# Patient Record
Sex: Male | Born: 1980 | Smoking: Never smoker
Health system: Southern US, Community
[De-identification: ages and names within clinical notes are randomized; demographics above are authoritative.]

## PROBLEM LIST (undated history)

## (undated) DIAGNOSIS — G950 Syringomyelia and syringobulbia: Secondary | ICD-10-CM

## (undated) DIAGNOSIS — M419 Scoliosis, unspecified: Secondary | ICD-10-CM

## (undated) DIAGNOSIS — M1990 Unspecified osteoarthritis, unspecified site: Secondary | ICD-10-CM

## (undated) HISTORY — PX: THORACIC FUSION: SHX1062

## (undated) HISTORY — DX: Scoliosis, unspecified: M41.9

## (undated) HISTORY — DX: Syringomyelia and syringobulbia: G95.0

## (undated) HISTORY — PX: IVC FILTER INSERTION: CATH118245

## (undated) HISTORY — DX: Unspecified osteoarthritis, unspecified site: M19.90

---

## 2002-10-07 DIAGNOSIS — T1490XS Injury, unspecified, sequela: Secondary | ICD-10-CM

## 2002-10-07 HISTORY — DX: Injury, unspecified, sequela: T14.90XS

## 2005-08-07 HISTORY — PX: THORACIC LAMINECTOMY: SHX96

## 2017-10-03 DIAGNOSIS — L89154 Pressure ulcer of sacral region, stage 4: Secondary | ICD-10-CM

## 2017-10-03 HISTORY — DX: Pressure ulcer of sacral region, stage 4: L89.154

## 2018-10-05 HISTORY — PX: COSMETIC SURGERY: SHX468

## 2018-11-09 HISTORY — PX: SPINE SURGERY: SHX786

## 2018-12-03 HISTORY — PX: OTHER SURGICAL HISTORY: SHX169

## 2020-04-19 ENCOUNTER — Encounter: Payer: Self-pay | Admitting: Physical Medicine and Rehabilitation

## 2020-05-22 ENCOUNTER — Encounter: Payer: Self-pay | Admitting: Physical Medicine and Rehabilitation

## 2020-05-22 ENCOUNTER — Other Ambulatory Visit: Payer: Self-pay

## 2020-05-22 ENCOUNTER — Encounter
Payer: Medicare Other | Attending: Physical Medicine and Rehabilitation | Admitting: Physical Medicine and Rehabilitation

## 2020-05-22 VITALS — BP 113/65 | HR 98 | Temp 98.1°F | Ht 74.0 in | Wt 155.0 lb

## 2020-05-22 DIAGNOSIS — G8220 Paraplegia, unspecified: Secondary | ICD-10-CM

## 2020-05-22 DIAGNOSIS — G839 Paralytic syndrome, unspecified: Secondary | ICD-10-CM | POA: Insufficient documentation

## 2020-05-22 DIAGNOSIS — R252 Cramp and spasm: Secondary | ICD-10-CM

## 2020-05-22 MED ORDER — DIAZEPAM 5 MG PO TABS
5.0000 mg | ORAL_TABLET | Freq: Two times a day (BID) | ORAL | 5 refills | Status: DC
Start: 2020-05-22 — End: 2020-07-21

## 2020-05-22 NOTE — Patient Instructions (Signed)
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 Suggest Botox of ileopsoas, if possible. Will be difficult- will look into. However, might have difficulties getting to ileopsoas.     2. [phone]- have left message for Dr Roderic Ovens   3. Might try Valium - the gold standard- have attempted to call pt's Pain doctor- x2- will discuss if could do Valium.   4.  Wait to change spasticity meds until d/w Dr Roderic Ovens- really think Valium is going to be the best bet.   5. Get neck surgery ASAP and then suggest if needs inpt rehab afterwards, suggest calling me to come to [facility].  6. Call him to discuss nerve pain meds. - Duloxetine vs Lyrica.   7. F/U in 3-4 weeks for double appointment

## 2020-05-22 NOTE — Progress Notes (Signed)
Subjective:    Patient ID: Devin Collins, adult    DOB: 1981/03/28, 39 y.o.   MRN: [id number]  HPI  Here with partner- [name].   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    Now, can't even lift w/c- cannot drive, cannot do anything.   The littlest bounce- falls out of w/c.Has fallen 5x in last few months.   Is from MRI That we discussed of his cervical spine.   Laminoplasty with possible fusion- is planned.  Wants to put a cage in, to stabilize.   Baclofen 20 mg TID Zanaflex 8 mg TID- takes usually 2x/day.    Pain management- Dr Barrie Dunker.  Perocet- 10/325 mg QID from pain mgmt.    Gets electrica shock through his entire body- so uses gabapentin but not enough.        Pain Inventory Average Pain 7 Pain Right Now 7 My pain is constant, sharp, burning, dull, stabbing, tingling and aching  LOCATION OF PAIN hips middle of back and neck  BOWEL Number of stools per week: 4-5 Oral laxative use Yes  Type of laxative miralax Enema or suppository use No  History of colostomy No  Incontinent digital stim  BLADDER Foley  Self cath not foley   Gets botox with Dr Elana Alm-- [facility] In and out cath, frequency 4-6 x per day Able to self cath Yes  Bladder incontinence Yes  Frequent urination na Leakage with coughing na Difficulty starting stream na Incomplete bladder emptying na   Mobility how many minutes can you walk? na do you drive?  yes use a wheelchair transfers alone  Function employed # of hrs/week na disabled: date disabled 04  Neuro/Psych bladder control problems weakness numbness tingling trouble  walking spasms dizziness  Prior Studies Any changes since last visit?  no  Physicians involved in your care Primary care Rhyne McPhail urologist/ pain management Dr Roderic Ovens [facility]   Family History  Problem Relation Age of Onset  . Arthritis Mother   . Hypertension Mother   . Arthritis Father   . Hyperlipidemia Father    Social History   Socioeconomic History  . Marital status: Unknown    Spouse name: Not on file  . Number of children: Not on file  . Years of education: Not on file  . Highest education level: Not on file  Occupational History  . Not on file  Tobacco Use  . Smoking status: Never Smoker  . Smokeless tobacco: Current User    Types: Snuff  . Tobacco comment: been using since 39 yo  Substance and Sexual Activity  . Alcohol use: Not on file  . Drug use: Yes    Frequency: 7.0 times per week    Types: Marijuana  . Sexual activity: Not on file  Other Topics Concern  . Not on file  Social History Narrative  . Not on file   Social Determinants of Health   Financial Resource Strain:   . Difficulty of Paying Living Expenses:   Food Insecurity:   . Worried About Programme researcher, broadcasting/film/video in the Last Year:   .  Ran Out of Food in the Last Year:   Transportation Needs:   . Freight forwarder (Medical):   Marland Kitchen Lack of Transportation (Non-Medical):   Physical Activity:   . Days of Exercise per Week:   . Minutes of Exercise per Session:   Stress:   . Feeling of Stress :   Social Connections:   . Frequency of Communication with Friends and Family:   . Frequency of Social Gatherings with Friends and Family:   . Attends Religious Services:   . Active Member of Clubs or Organizations:   . Attends Banker Meetings:   Marland Kitchen Marital Status:    Past Surgical History:  Procedure Laterality Date  . COSMETIC SURGERY  10/05/2018   flap closure sacral wound  . IVC FILTER INSERTION    . posterior lumbar laminectomy and fusion  12/03/2018  . SPINE  SURGERY  11/09/2018   lumbar fusion   . THORACIC FUSION     T10-11  . THORACIC LAMINECTOMY  08/2005   Past Medical History:  Diagnosis Date  . Arthritis   . Decubitus ulcer of sacral region, stage 4 (HCC) 10/03/2017  . Post-traumatic paraplegia 2004  . Scoliosis   . 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(HCC)    Pulse 98   Temp 98.1 F (36.7 C)   SpO2 95%   Opioid Risk Score:   Fall Risk Score:  `1  Depression screen PHQ 2/9  Depression screen PHQ 2/9 05/22/2020  Decreased Interest 0  Down, Depressed, Hopeless 1  PHQ - 2 Score 1  Altered sleeping 2  Tired, decreased energy 1  Change in appetite 1  Feeling bad or failure about yourself  2  Trouble concentrating 1  Moving slowly or fidgety/restless 0  Suicidal thoughts 0  PHQ-9 Score 8  Difficult doing work/chores Very difficult    Review of Systems  Constitutional: Negative.   HENT: Negative.   Eyes: Negative.   Respiratory: Negative.   Cardiovascular: Negative.   Gastrointestinal:       Digital stim/laxative for bm  Endocrine: Negative.   Genitourinary:       I&O caths  Musculoskeletal: Positive for back pain, myalgias and neck pain.       Spasms  Skin: Negative.        Prone to breakdowns but healed currently  Allergic/Immunologic: Negative.   Neurological: Positive for weakness.  Hematological: Negative.   Psychiatric/Behavioral: Negative.   All other systems reviewed and are negative.      Objective:   Physical Exam Awake, alert, accompanied by [name],partner, NAD  MS: RUE- deltoid 4/5, biceps 4/5, WE- 5-/5, triceps 4+/5, grip 4+/5, finger abd 4+/5 LUE- deltoid 2/5 (can't get to 90 degrees), biceps 4-/5, WE- 4-/5, triceps 4-/5;  Grip 4-/5, finger abd 4-/5  Neuro: No Hoffman's in UEs- B/L No increased tone in UEs-  LEs- MAS of 2 in LEs- tight- goes into extensor tone with hip extension.       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 Suggest Botox of ileopsoas, if possible. Will be difficult- will look into. However, might have difficulties getting to ileopsoas.     2. [phone]- have left message for Dr Roderic Ovens   3. Might try Valium - the gold standard- have attempted to call pt's Pain doctor- x2- will discuss if could do Valium.   4.  Wait  to change spasticity meds until d/w Dr Roderic Ovens- really think Valium is going to be the best bet.  Wrote for Valium 5 mg 2x/day- can wean Zanaflex somewhat 4 mg 2x/day???  5. Get neck surgery ASAP and then suggest if needs inpt rehab afterwards, suggest calling me to come to [facility].  6. Call him to discuss nerve pain meds. - Duloxetine vs Lyrica.   7. F/U in 3-4 weeks for double appointment  I spent a total of  50 minutes on visit- as detailed above.

## 2020-06-09 ENCOUNTER — Encounter: Payer: Medicare Other | Admitting: Physical Medicine and Rehabilitation

## 2020-07-21 ENCOUNTER — Encounter
Payer: Medicare Other | Attending: Physical Medicine and Rehabilitation | Admitting: Physical Medicine and Rehabilitation

## 2020-07-21 ENCOUNTER — Other Ambulatory Visit: Payer: Self-pay

## 2020-07-21 ENCOUNTER — Encounter: Payer: Self-pay | Admitting: Physical Medicine and Rehabilitation

## 2020-07-21 VITALS — BP 119/65 | HR 75 | Temp 98.3°F

## 2020-07-21 DIAGNOSIS — G8220 Paraplegia, unspecified: Secondary | ICD-10-CM | POA: Diagnosis present

## 2020-07-21 DIAGNOSIS — Z993 Dependence on wheelchair: Secondary | ICD-10-CM | POA: Diagnosis present

## 2020-07-21 DIAGNOSIS — R252 Cramp and spasm: Secondary | ICD-10-CM | POA: Diagnosis present

## 2020-07-21 MED ORDER — DIAZEPAM 5 MG PO TABS
5.0000 mg | ORAL_TABLET | Freq: Three times a day (TID) | ORAL | 5 refills | Status: DC
Start: 2020-07-21 — End: 2021-01-05

## 2020-07-21 MED ORDER — DULOXETINE HCL 30 MG PO CPEP: 30.0000 mg | ORAL_CAPSULE | Freq: Every day | ORAL | 5 refills | Status: DC

## 2020-07-21 NOTE — Patient Instructions (Signed)
1. Will check to see if can get Botox of L hip flexors If possible.   2. Increase Valium 5 mg 3x/day- 1st dose, AM, 2nd dose before dig stim, and then 3rd dose bedtime   3.  Duloxetine /Cymbalta 30 mg nightly x 1 week  Then 60 mg nightly- for nerve pain  1% of patients can have nausea with Duloxetine- call me if needs an anti-nausea medicine. Can also cause mild dry mouth/dry eyes and mild constipation.   4. Continue Gabapentin at current dose- won't increase right now.   5. Con't Baclofen at THREE times per day.  6. Call me and f/u in 10 days- call to put in PT referral AND discuss how Duloxetine working. And spasticity  7. PICC line to be removed- 10/26 the earliest- 11/9 at the latest.  8. F/U in 8 weeks- double appointment.

## 2020-07-21 NOTE — Progress Notes (Signed)
Subjective:    Patient ID: Devin Collins, adult    DOB: Sep 26, 1981, 39 y.o.   MRN: [id number]  HPI   Legs and abdomen spasming- LLE- RLE never jumps.   L hip is higher when sitting and laying down. Than R hip.    Frustrated with everything- hard to do dig stim now and driving him nuts.  Valium 5 mg BID- still taking- helped initially, but not as much now-   decreased Zanaflex when takes Valium- makes him too sleepy- So has been taking 6 mg 2x/day- and 8 mg at night.   Likes muscle very loose Knees pull up when laying down in bed Used to lay on stomach- but cannot right now very easily, so not stretching because neck surgery.  Felt great after surgery- kept in prone position for 6+ hours for surgery, and "fixed the problem" for a few  Hours, better than anything.    Feels nerve sensations up arms- when moves arms.  Really irritating.   Has stage II pressure ulcer From the PT making him transfer into hospital w/c, and rubbed butt on wheel and metal bar.  When was in hospital at [facility]- refused to wait for his w/c to get there.  Has healed 1 cm since then, and seeing wound care.    Baclofen- only taking 2x/day- skipping lunch meds- here lately, since been bedridden.  At least 1 month  Per pt- wife said it's been 1 year hasn't taken 3x/day.   Gets up by 6-7am in the morning. Baclofen has worn off, by now, for sure, then.           Pain Inventory Average Pain 7 Pain Right Now 6 My pain is constant, sharp, dull, stabbing, tingling and aching  In the last 24 hours, has pain interfered with the following? General activity 8 Relation with others 9 Enjoyment of life 10 What TIME of day is your pain at its worst? daytime, evening and night Sleep (in general) Poor  Pain is worse with: sitting and some activites Pain improves with: medication Relief from Meds: 5  Family History  Problem Relation Age of Onset  . Arthritis Mother   . Hypertension Mother   .  Arthritis Father   . Hyperlipidemia Father    Social History   Socioeconomic History  . Marital status: Unknown    Spouse name: Not on file  . Number of children: Not on file  . Years of education: Not on file  . Highest education level: Not on file  Occupational History  . Not on file  Tobacco Use  . Smoking status: Never Smoker  . Smokeless tobacco: Current User    Types: Snuff  . Tobacco comment: been using since 39 yo  Substance and Sexual Activity  . Alcohol use: Yes    Alcohol/week: 7.0 standard drinks    Types: 7 Standard drinks or equivalent per week    Comment: one daily  . Drug use: Yes    Frequency: 7.0 times per week    Types: Marijuana  . Sexual activity: Not on file  Other Topics Concern  . Not on file  Social History Narrative  . Not on file   Social Determinants of Health   Financial Resource Strain:   . Difficulty of Paying Living Expenses: Not on file  Food Insecurity:   . Worried About Programme researcher, broadcasting/film/video in the Last Year: Not on file  . Ran Out of Food in the Last Year:  Not on file  Transportation Needs:   . Lack of Transportation (Medical): Not on file  . Lack of Transportation (Non-Medical): Not on file  Physical Activity:   . Days of Exercise per Week: Not on file  . Minutes of Exercise per Session: Not on file  Stress:   . Feeling of Stress : Not on file  Social Connections:   . Frequency of Communication with Friends and Family: Not on file  . Frequency of Social Gatherings with Friends and Family: Not on file  . Attends Religious Services: Not on file  . Active Member of Clubs or Organizations: Not on file  . Attends Banker Meetings: Not on file  . Marital Status: Not on file   Past Surgical History:  Procedure Laterality Date  . COSMETIC SURGERY  10/05/2018   flap closure sacral wound  . IVC FILTER INSERTION    . posterior lumbar laminectomy and fusion  12/03/2018  . SPINE SURGERY  11/09/2018   lumbar fusion   .  THORACIC FUSION     T10-11  . THORACIC LAMINECTOMY  08/2005   Past Surgical History:  Procedure Laterality Date  . COSMETIC SURGERY  10/05/2018   flap closure sacral wound  . IVC FILTER INSERTION    . posterior lumbar laminectomy and fusion  12/03/2018  . SPINE SURGERY  11/09/2018   lumbar fusion   . THORACIC FUSION     T10-11  . THORACIC LAMINECTOMY  08/2005   Past Medical History:  Diagnosis Date  . Arthritis   . Decubitus ulcer of sacral region, stage 4 (HCC) 10/03/2017  . Post-traumatic paraplegia 2004  . Scoliosis   . 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(HCC)    BP 119/65   Pulse 75   Temp 98.3 F (36.8 C)   SpO2 97%   Opioid Risk Score:   Fall Risk Score:  `1  Depression screen PHQ 2/9  Depression screen PHQ 2/9 05/22/2020  Decreased Interest 0  Down, Depressed, Hopeless 1  PHQ - 2 Score 1  Altered sleeping 2  Tired, decreased energy 1  Change in appetite 1  Feeling bad or failure about yourself  2  Trouble concentrating 1  Moving slowly or fidgety/restless 0  Suicidal thoughts 0  PHQ-9 Score 8  Difficult doing work/chores Very difficult   Review of Systems An entire ROS was completed and found to be negative except for HPI    Objective:   Physical Exam  Awake, alert, appropriate, accompanied by wife, NAD MAS of 1+ I to 2 in L hip; 1 otherwise in LEs Pretty loose, considering, but pt likes to be VERY loose.       Assessment & Plan:    1. Will check to see if can get Botox of L hip flexors If possible.   2. Increase Valium 5 mg 3x/day- 1st dose, AM, 2nd dose before dig stim, and then 3rd dose bedtime   3.  Duloxetine /Cymbalta 30 mg nightly x 1 week  Then 60 mg nightly- for nerve pain  1% of patients can have nausea with Duloxetine- call me if needs an anti-nausea medicine. Can also cause mild dry mouth/dry eyes and mild constipation.   4. Continue Gabapentin at current dose- won't increase right now.   5. Con't Baclofen at THREE times per day.  6.  Call me and f/u in 10 days- call to put in PT referral AND discuss how Duloxetine working. And spasticity  7. PICC line to be removed-  10/26 the earliest- 11/9 at the latest.  8. F/U in 8 weeks- double appointment.    I spent a total of 40 minutes on appointment - as detailed above.

## 2020-08-30 ENCOUNTER — Encounter: Payer: Self-pay | Admitting: Physical Medicine and Rehabilitation

## 2020-08-30 ENCOUNTER — Encounter
Payer: Medicare Other | Attending: Physical Medicine and Rehabilitation | Admitting: Physical Medicine and Rehabilitation

## 2020-08-30 ENCOUNTER — Other Ambulatory Visit: Payer: Self-pay

## 2020-08-30 VITALS — BP 118/85 | HR 94 | Temp 98.0°F

## 2020-08-30 DIAGNOSIS — G8220 Paraplegia, unspecified: Secondary | ICD-10-CM | POA: Diagnosis not present

## 2020-08-30 DIAGNOSIS — M7918 Myalgia, other site: Secondary | ICD-10-CM | POA: Diagnosis present

## 2020-08-30 DIAGNOSIS — M62838 Other muscle spasm: Secondary | ICD-10-CM | POA: Insufficient documentation

## 2020-08-30 DIAGNOSIS — R252 Cramp and spasm: Secondary | ICD-10-CM | POA: Insufficient documentation

## 2020-08-30 DIAGNOSIS — Z993 Dependence on wheelchair: Secondary | ICD-10-CM | POA: Insufficient documentation

## 2020-08-30 DIAGNOSIS — R6889 Other general symptoms and signs: Secondary | ICD-10-CM | POA: Insufficient documentation

## 2020-08-30 NOTE — Patient Instructions (Addendum)
Plan 1. Patient here for trigger point injections for  Consent done and on chart.  Cleaned areas with alcohol and injected using a 27 gauge 1.5 inch needle  Injected  6cc Using 1% Lidocaine with no EPI  Upper traps B/L Levators B/L Posterior scalenes B/L Middle scalenes B/L behind ears Splenius Capitus B/L x2 Pectoralis Major B/L Rhomboids B/L Infraspinatus Teres Major/minor Thoracic paraspinals R side- x3 Lumbar paraspinals Other injections-    Patient's level of pain prior was 8-9/10 Current level of pain after injections is 4/10  There was no bleeding or complications.  Patient was advised to drink a lot of water on day after injections to flush system Will have increased soreness for 12-48 hours after injections.  Can use Lidocaine patches the day AFTER injections Can use theracane on day of injections in places didn't inject Can use heating pad 4-6 hours AFTER injections  2. Check TSH due to cold intolerance as well as spasms which can be triggered by thyroid disease.   3. Think pt's ileopsoas needs Botox- and Neurology agreed at [facility] - based on pt descriptions- can't get him out of w/c to test directly.  Will see if Dr Wynn Banker can do under [facility].   4. Will send to outpatient PT- Neuro Rehab.   5. F/U in 6 weeks Was given Rx for Dantrolene- 25 mg QD and then BID.

## 2020-08-30 NOTE — Progress Notes (Signed)
  Patient is a 39 yr old male with   Has "balls" of trigger points "everywhere.   Can feel muscle spasms/pulling when leans forward. Also tight in chest muscles,    Plan 1. Patient here for trigger point injections for  Consent done and on chart.  Cleaned areas with alcohol and injected using a 27 gauge 1.5 inch needle  Injected  6cc Using 1% Lidocaine with no EPI  Upper traps B/L Levators B/L Posterior scalenes B/L Middle scalenes B/L behind ears Splenius Capitus B/L x2 Pectoralis Major B/L Rhomboids B/L Infraspinatus Teres Major/minor Thoracic paraspinals R side- x3 Lumbar paraspinals Other injections-    Patient's level of pain prior was 8-9/10 Current level of pain after injections is 4/10  There was no bleeding or complications.  Patient was advised to drink a lot of water on day after injections to flush system Will have increased soreness for 12-48 hours after injections.  Can use Lidocaine patches the day AFTER injections Can use theracane on day of injections in places didn't inject Can use heating pad 4-6 hours AFTER injections  2. Check TSH due to cold intolerance as well as spasms which can be triggered by thyroid disease.   3. Think pt's ileopsoas needs Botox- and Neurology agreed at [facility] - based on pt descriptions- can't get him out of w/c to test directly.  Will see if Dr Wynn Banker can do under [facility].   4. Will send to outpatient PT- Neuro Rehab.   5. F/U in 6 weeks Was given Rx for Dantrolene- 25 mg QD and then BID.   I spent a total of 45 minutes on visit- as detailed above.

## 2020-08-31 LAB — TSH: TSH: 0.822 u[IU]/mL (ref 0.450–4.500)

## 2020-09-15 ENCOUNTER — Ambulatory Visit: Payer: Medicare Other | Admitting: Physical Medicine and Rehabilitation

## 2020-10-05 ENCOUNTER — Encounter: Payer: Medicare Other | Attending: Physical Medicine and Rehabilitation | Admitting: Physical Medicine & Rehabilitation

## 2020-10-05 ENCOUNTER — Other Ambulatory Visit: Payer: Self-pay

## 2020-10-05 ENCOUNTER — Encounter: Payer: Self-pay | Admitting: Physical Medicine & Rehabilitation

## 2020-10-05 VITALS — BP 129/79 | HR 73 | Temp 98.4°F

## 2020-10-05 DIAGNOSIS — G8220 Paraplegia, unspecified: Secondary | ICD-10-CM | POA: Insufficient documentation

## 2020-10-05 DIAGNOSIS — R252 Cramp and spasm: Secondary | ICD-10-CM

## 2020-10-05 NOTE — Progress Notes (Signed)
Ultrasound-guided Botox Injection for left hip flexor spasticity using needle EMG guidance  Dilution: 50 units/ml Indication: Severe spasticity which interferes with ADL,mobility and/or  hygiene and is unresponsive to medication management and other conservative care Informed consent was obtained after describing risks and benefits of the procedure with the patient. This includes bleeding, bruising, infection, excessive weakness, or medication side effects. A REMS form is on file and signed. Needle: 25-gauge 50 mm needle electrode Number of units per muscle Left lower extremity Iliopsoas 100 units, muscles located above the proximal femur, transverse imaging with lateral to medial approach, the femoral artery was identified medial to the targeted muscle. Rectus femoris 100 units this was performed without ultrasound guidance All injections were done after obtaining appropriate EMG activity and after negative drawback for blood. The patient tolerated the procedure well. Post procedure instructions were given. A followup appointment was made with Dr. Berline Chough.  If no significant effect may consider repeating with increased dosing as well as echo block 80 needle to target deeper portion of iliopsoas muscle

## 2020-10-05 NOTE — Patient Instructions (Signed)

## 2020-10-12 ENCOUNTER — Other Ambulatory Visit: Payer: Self-pay

## 2020-10-12 ENCOUNTER — Encounter: Payer: Self-pay | Admitting: Physical Therapy

## 2020-10-12 ENCOUNTER — Ambulatory Visit: Payer: Medicare Other | Attending: Physical Medicine and Rehabilitation | Admitting: Physical Therapy

## 2020-10-12 DIAGNOSIS — R29818 Other symptoms and signs involving the nervous system: Secondary | ICD-10-CM | POA: Diagnosis present

## 2020-10-12 DIAGNOSIS — G8221 Paraplegia, complete: Secondary | ICD-10-CM | POA: Diagnosis present

## 2020-10-12 DIAGNOSIS — M6281 Muscle weakness (generalized): Secondary | ICD-10-CM

## 2020-10-12 DIAGNOSIS — M62830 Muscle spasm of back: Secondary | ICD-10-CM | POA: Diagnosis present
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
## 2020-10-16 ENCOUNTER — Other Ambulatory Visit: Payer: Self-pay

## 2020-10-16 ENCOUNTER — Encounter
Payer: Medicare Other | Attending: Physical Medicine and Rehabilitation | Admitting: Physical Medicine and Rehabilitation

## 2020-10-16 ENCOUNTER — Encounter: Payer: Self-pay | Admitting: Physical Medicine and Rehabilitation

## 2020-10-16 VITALS — BP 134/73 | HR 92 | Temp 98.2°F | Ht 74.0 in | Wt 155.0 lb

## 2020-10-16 DIAGNOSIS — R252 Cramp and spasm: Secondary | ICD-10-CM

## 2020-10-16 DIAGNOSIS — Z993 Dependence on wheelchair: Secondary | ICD-10-CM | POA: Diagnosis present

## 2020-10-16 DIAGNOSIS — M7918 Myalgia, other site: Secondary | ICD-10-CM | POA: Diagnosis present

## 2020-10-16 DIAGNOSIS — G8220 Paraplegia, unspecified: Secondary | ICD-10-CM

## 2020-10-16 NOTE — Patient Instructions (Signed)
Plan: Myofascial pain- 1 Show relaxation technique for teeth/gums/mouth   2. Showed myofascial release for arms- pulls arms upwards, slowly- stop when you feel a "spot"- and goal is to get up to 120-145 degrees.   3. Patient here for trigger point injections for  Consent done and on chart.  Cleaned areas with alcohol and injected using a 27 gauge 1.5 inch needle  Injected  Using 1% Lidocaine with no EPI  Upper traps B/L Levators B/L Posterior scalenes B/L x2 and R post auricular Middle scalenes/SCM dry needled B/L Splenius Capitus B/L x2 Pectoralis Major B/L Rhomboids B/L x2 Infraspinatus Teres Major/minor Thoracic paraspinals B/L Lumbar paraspinals Other injections-  Lots of twitch responses seen, FYI  Patient's level of pain prior was 7-8/10 Current level of pain after injections is 3-4/10-   There was no bleeding or complications.  Patient was advised to drink a lot of water on day after injections to flush system Will have increased soreness for 12-48 hours after injections.  Can use Lidocaine patches the day AFTER injections Can use theracane on day of injections in places didn't inject Can use heating pad 4-6 hours AFTER injections   4. Try tennis balls for piriformis tightening.  5. Spasticity better s/p Botox.    6. Try 1/2 dose Miralax first for a few days to 1 week, then increase to 1 capful.   7. F/U- 1 month for trigger point injections - started PT last week.
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
## 2020-10-19 ENCOUNTER — Ambulatory Visit: Payer: Medicare Other | Admitting: Physical Therapy

## 2020-10-19 ENCOUNTER — Other Ambulatory Visit: Payer: Self-pay

## 2020-10-19 DIAGNOSIS — R29818 Other symptoms and signs involving the nervous system: Secondary | ICD-10-CM | POA: Diagnosis not present

## 2020-10-19 DIAGNOSIS — G8221 Paraplegia, complete: Secondary | ICD-10-CM

## 2020-10-19 DIAGNOSIS — M62830 Muscle spasm of back: Secondary | ICD-10-CM

## 2020-10-20 ENCOUNTER — Encounter: Payer: Self-pay | Admitting: Physical Therapy

## 2020-10-20 NOTE — Therapy (Signed)
[facility] [address] [place], [place], [address] Phone: [phone]   Fax:  [phone]  Physical Therapy Treatment  Patient Details  Name: Devin Collins MRN: [id number] Date of Birth: April 29, 1981 Referring Provider (PT): Genice Rouge, MD   Encounter Date: 10/19/2020   PT End of Session - 10/20/20 1344    Visit Number 2    Number of Visits 13   eval + 2x/week x 4 weeks; 1x/ wk x 4 wks = 13 visits   Authorization Type Medicare/Medicaid    Authorization Time Period 10-12-20 - 01-10-21    PT Start Time 0800    PT Stop Time 0846    PT Time Calculation (min) 46 min    Equipment Utilized During Treatment Back brace   pt wearing lumbar support brace   Activity Tolerance Patient tolerated treatment well    Behavior During Therapy [facility] for tasks assessed/performed           Past Medical History:  Diagnosis Date  . Arthritis   . Decubitus ulcer of sacral region, stage 4 (HCC) 10/03/2017  . Post-traumatic paraplegia 2004  . Scoliosis   . 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[facility])     Past Surgical History:  Procedure Laterality Date  . COSMETIC SURGERY  10/05/2018   flap closure sacral wound  . IVC FILTER INSERTION    . posterior lumbar laminectomy and fusion  12/03/2018  . SPINE SURGERY  11/09/2018   lumbar fusion   . THORACIC FUSION     T10-11  . THORACIC LAMINECTOMY  08/2005    There were no vitals filed for this visit.   Subjective Assessment - 10/20/20 1328    Subjective Pt states he saw Dr. Berline Chough on Monday this week for trigger point injections - states it helped some with the tightness    Patient is accompained by: Family member   wife [name]   Pertinent History T10 SCI paraplegia 2004;  Posterior cervical C4-5 laminectomy & posterior cervical fusion C3-6 with local autograft on 06-08-20 due to cervical stenosis of spinal canal;  Readmission to hospital on 06-19-20 due to surgical site infection;  h/o decubitus ulcer  s/p reconstruction 10-05-18:  lumbar fusion to T10 11-09-18    Limitations Walking;Standing;House hold activities;Other (comment)   driving   Patient Stated Goals "get stronger to where I'm not as weak and get some stability back"    Currently in Pain? Other (Comment)   tightness in back and hips - not really pain                            [facility] Adult PT Treatment/Exercise - 10/20/20 0001      Transfers   Transfers Lateral/Scoot Transfers    Lateral/Scoot Transfers 5: Supervision    Number of Reps Other reps (comment)   2 reps - w/c to/from mat   Comments pt transferred w/c to/from mat with SBA - lateral scoot transfer           TherEx: Following stretches performed for bil. LE and trunk stretching:  Pt sat on side of mat -used blue large physioball -- rolled ball straight ahead - hold 30 secs x 1 rep Rolled ball to Rt side for Lt sided trunk stretch 30 sec hold x 1 rep; then rolled ball to left side for Rt sided trunk stretch 30 sec hold x 1 rep  Pt performed long sitting on mat for hamstring stretch -  30 sec hold  Piriformis stretch bil. LE's (Lt tighter than Rt);  See exercises below for additional stretches performed and issued for HEP    Access Code: [email or web address] URL: [email or web address] Date: 10/20/2020 Prepared by: Maebelle Munroe  Exercises -- HEP from Medbridge Supine Hamstring Stretch with Caregiver - 1 x daily - 7 x weekly - 1 sets - 2-3 reps - 30 hold Supine Single Knee to Chest Stretch - 1 x daily - 7 x weekly - 1 sets - 10 reps - 2 sec hold Supine Piriformis Stretch with Leg Straight - 1 x daily - 7 x weekly - 1 sets - 3 reps - 15-20 sec hold Supine Hip Adductor Stretch with Caregiver - 1 x daily - 7 x weekly - 1 sets - 5 reps - 10-15 sec hold hold Lying Prone - 1 x daily - 7 x weekly - 1 sets - 10 reps - 1"-2" hold Seated Flexion Stretch with Swiss Ball - 1 x daily - 7 x weekly - 3 sets - 1 reps - 30 hold   Bil. Hip flexor stretch  performed with pt in sidelying position - 30 sec hold x 2 reps each  Pt lied in prone position; performed bil. Quad stretch; pushed up on forearms for trunk/back stretch  10 push ups for bil. Shoulder strengthening      PT Short Term Goals - 10/20/20 1350      PT SHORT TERM GOAL #1   Title Pt will increase Rt shoulder flexion to >/= 155 degrees and Lt shoulder flexion to >/= 160 degrees to enable increased independence with ADL's such as upper body dressing and washing hair.    Baseline Rt shoulder flexion 134;  Lt shoulder flexion 140    Time 4    Period Weeks    Status New    Target Date 11/10/20      PT SHORT TERM GOAL #2   Title Pt will improve sitting balance so pt is able to reach forward at least 10" with LUE without RUE support for balance.    Baseline LUE reach 8";  RUE reach 11"    Time 4    Period Weeks    Status New    Target Date 11/10/20      PT SHORT TERM GOAL #3   Title Pt will subjectively report at least 25% improvement in pain and discomfort due to spasticity in LE's and trunk.    Time 4    Period Weeks    Status New    Target Date 11/10/20      PT SHORT TERM GOAL #4   Title Independent in HEP for stretching exs for LE's and trunk musculature.    Time 4    Period Weeks    Status New    Target Date 11/10/20             PT Long Term Goals - 10/20/20 1350      PT LONG TERM GOAL #1   Title Pt will subjectively report at least 50% improvement in spasticity/tightness in LE's and in trunk for increased independence and ease with transfers and ADL's such as dressing & grooming.    Time 8    Period Weeks    Status New      PT LONG TERM GOAL #2   Title Pt will report ability to independently load his wheelchair into car to allow independence with return to driving.    Time 8    Period Weeks  Status New      PT LONG TERM GOAL #3   Title Pt will increase strength in bil. Shoulder flexors to at least 4+/5 to increase independence with lifting  wheelchair in and out of car.    Time 8    Period Weeks    Status New      PT LONG TERM GOAL #4   Title Independent in updated HEP for stretching and UE strengthening.    Time 8    Period Weeks    Status New                 Plan - 10/20/20 1345    Clinical Impression Statement Pt reported signficantly decreased muscle tightness in back, shoulders and hips at end of session after PROM and stretches performed.  Pt reports feeling as if he is sitting on a ball (due to piriformis tightness) - this sensation relieved moderately after performing passive piriformis stretch LLE.  Pt able to reach easier with LUE in propped on elbows in prone position due to increased strength in Rt shoulder/scapular region compared to LUE.  Cont with POC.    Personal Factors and Comorbidities Comorbidity 2;Past/Current Experience;Time since onset of injury/illness/exacerbation    Comorbidities T10 SCI with paraplegia, h/o cervical fusion 06-08-20, surgical site infection with hospital readmission 06-19-20 for I&D, h/o lumbar fusion Feb. 2020, h/o flap surgery stage 3 Dec. 2019    Examination-Activity Limitations Bed Mobility;Bend;Dressing;Squat;Bathing;Lift;Stand;Locomotion Level;Transfers;Stairs    Examination-Participation Restrictions Cleaning;Meal Prep;Yard Work;Driving;Laundry;Shop;Community Activity    Stability/Clinical Decision Making Evolving/Moderate complexity    Rehab Potential Good    PT Frequency 2x / week   decr. to 1x/ x 4 weeks after 1st month   PT Duration 4 weeks    PT Treatment/Interventions ADLs/Self Care Home Management;Therapeutic exercise;Therapeutic activities;Functional mobility training;Patient/family education;Neuromuscular re-education;Balance training;Passive range of motion;Manual techniques;Dry needling    PT Next Visit Plan check stretching HEP for LE's and trunk - Medbridge; try lying on bolster for stretching anterior trunk    PT Home Exercise Plan Medbridge [email or web address]     Consulted and Agree with Plan of Care Patient;Family member/caregiver    Family Member Consulted wife [name]           Patient will benefit from skilled therapeutic intervention in order to improve the following deficits and impairments:  Decreased strength,Decreased range of motion,Impaired tone,Decreased mobility,Decreased balance,Impaired flexibility,Increased muscle spasms  Visit Diagnosis: Paraplegia, complete (HCC)  Other symptoms and signs involving the nervous system  Muscle spasm of back     Problem List Patient Active Problem List   Diagnosis Date Noted  . Wheelchair dependence 07/21/2020  . Paraplegia (HCC) 05/22/2020  . Spasticity 05/22/2020    DildayDonavan Burnet, PT 10/20/2020, 1:53 PM  [facility] [address] [place], [place], [address] Phone: [phone]   Fax:  [phone]  Name: Devin Collins MRN: [id number] Date of Birth: [birth year]

## 2020-10-31 ENCOUNTER — Ambulatory Visit: Payer: Medicare Other | Admitting: Physical Therapy

## 2020-10-31 ENCOUNTER — Other Ambulatory Visit: Payer: Self-pay

## 2020-10-31 DIAGNOSIS — R29818 Other symptoms and signs involving the nervous system: Secondary | ICD-10-CM | POA: Diagnosis not present

## 2020-10-31 DIAGNOSIS — M62830 Muscle spasm of back: Secondary | ICD-10-CM

## 2020-10-31 DIAGNOSIS — M6281 Muscle weakness (generalized): Secondary | ICD-10-CM

## 2020-11-01 ENCOUNTER — Encounter: Payer: Self-pay | Admitting: Physical Therapy

## 2020-11-01 NOTE — Therapy (Signed)
[facility] [address] [place], [place], [address] Phone: [phone]   Fax:  [phone]  Physical Therapy Treatment  Patient Details  Name: Devin Collins MRN: [id number] Date of Birth: 1980-12-01 Referring Provider (PT): Genice Rouge, MD   Encounter Date: 10/31/2020   PT End of Session - 11/01/20 1652    Visit Number 3    Number of Visits 13   eval + 2x/week x 4 weeks; 1x/ wk x 4 wks = 13 visits   Authorization Type Medicare/Medicaid    Authorization Time Period 10-12-20 - 01-10-21    PT Start Time 0803    PT Stop Time 0850    PT Time Calculation (min) 47 min    Equipment Utilized During Treatment Back brace   pt wearing lumbar support brace; used noodle and theraband   Activity Tolerance Patient tolerated treatment well    Behavior During Therapy [facility] for tasks assessed/performed           Past Medical History:  Diagnosis Date  . Arthritis   . Decubitus ulcer of sacral region, stage 4 (HCC) 10/03/2017  . Post-traumatic paraplegia 2004  . Scoliosis   . 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[facility])     Past Surgical History:  Procedure Laterality Date  . COSMETIC SURGERY  10/05/2018   flap closure sacral wound  . IVC FILTER INSERTION    . posterior lumbar laminectomy and fusion  12/03/2018  . SPINE SURGERY  11/09/2018   lumbar fusion   . THORACIC FUSION     T10-11  . THORACIC LAMINECTOMY  08/2005    There were no vitals filed for this visit.      TherEx.:   Pt performed stretches for trunk and bil. Shoulders - pt transferred from w/chair to mat with supervision; lied supine with large noodle along spine Used dowel rod for shoulder flexion with 10 sec hold for stretching as tolerated Horizontal abduction of each shoulder with passive piriformis stretch of each leg   Bil. LE's positioned on stool for passive bil. Knee extension    Pillow placed under pt's Lt hip for elevation as increased tightness noted,  resulting in trunk rotation with Lt pelvic obliquity          [facility] Adult PT Treatment/Exercise - 11/01/20 0001      Exercises   Exercises Knee/Hip;Shoulder      Knee/Hip Exercises: Aerobic   Recumbent Bike SciFit level 8 x 5" (UE's only); hands on lower handles to achieve greater stretch in shoulders and trunk      Shoulder Exercises: Seated   Extension Strengthening;Both;10 reps   with red theraband   Retraction Strengthening;Both;10 reps   with red theraband   Horizontal ABduction Strengthening;Both;10 reps   with red theraband   Abduction Strengthening;Both;10 reps   with   Other Seated Exercises diagonal theraband pulls "X" pattern - 10 reps each diagonal                    PT Short Term Goals - 11/01/20 1656      PT SHORT TERM GOAL #1   Title Pt will increase Rt shoulder flexion to >/= 155 degrees and Lt shoulder flexion to >/= 160 degrees to enable increased independence with ADL's such as upper body dressing and washing hair.    Baseline Rt shoulder flexion 134;  Lt shoulder flexion 140    Time 4    Period Weeks    Status New  Target Date 11/10/20      PT SHORT TERM GOAL #2   Title Pt will improve sitting balance so pt is able to reach forward at least 10" with LUE without RUE support for balance.    Baseline LUE reach 8";  RUE reach 11"    Time 4    Period Weeks    Status New    Target Date 11/10/20      PT SHORT TERM GOAL #3   Title Pt will subjectively report at least 25% improvement in pain and discomfort due to spasticity in LE's and trunk.    Time 4    Period Weeks    Status New    Target Date 11/10/20      PT SHORT TERM GOAL #4   Title Independent in HEP for stretching exs for LE's and trunk musculature.    Time 4    Period Weeks    Status New    Target Date 11/10/20             PT Long Term Goals - 11/01/20 1656      PT LONG TERM GOAL #1   Title Pt will subjectively report at least 50% improvement in spasticity/tightness in  LE's and in trunk for increased independence and ease with transfers and ADL's such as dressing & grooming.    Time 8    Period Weeks    Status New      PT LONG TERM GOAL #2   Title Pt will report ability to independently load his wheelchair into car to allow independence with return to driving.    Time 8    Period Weeks    Status New      PT LONG TERM GOAL #3   Title Pt will increase strength in bil. Shoulder flexors to at least 4+/5 to increase independence with lifting wheelchair in and out of car.    Time 8    Period Weeks    Status New      PT LONG TERM GOAL #4   Title Independent in updated HEP for stretching and UE strengthening.    Time 8    Period Weeks    Status New                 Plan - 11/01/20 1653    Clinical Impression Statement Pt tolerated strengthening exercises with red theraband well; reported tightness with performing PRE's.  Pt reported feeling moderate stretches in trunk and LE's with lying supine on noodle to open up chest cavity and to fascilitate trunk extension.  Cont with POC.    Personal Factors and Comorbidities Comorbidity 2;Past/Current Experience;Time since onset of injury/illness/exacerbation    Comorbidities T10 SCI with paraplegia, h/o cervical fusion 06-08-20, surgical site infection with hospital readmission 06-19-20 for I&D, h/o lumbar fusion Feb. 2020, h/o flap surgery stage 3 Dec. 2019    Examination-Activity Limitations Bed Mobility;Bend;Dressing;Squat;Bathing;Lift;Stand;Locomotion Level;Transfers;Stairs    Examination-Participation Restrictions Cleaning;Meal Prep;Yard Work;Driving;Laundry;Shop;Community Activity    Stability/Clinical Decision Making Evolving/Moderate complexity    Rehab Potential Good    PT Frequency 2x / week   decr. to 1x/ x 4 weeks after 1st month   PT Duration 4 weeks    PT Treatment/Interventions ADLs/Self Care Home Management;Therapeutic exercise;Therapeutic activities;Functional mobility training;Patient/family  education;Neuromuscular re-education;Balance training;Passive range of motion;Manual techniques;Dry needling    PT Next Visit Plan check stretching HEP for LE's and trunk - Medbridge;    PT Home Exercise Plan Medbridge [id number]  Consulted and Agree with Plan of Care Patient;Family member/caregiver    Family Member Consulted wife Devin Collins           Patient will benefit from skilled therapeutic intervention in order to improve the following deficits and impairments:  Decreased strength,Decreased range of motion,Impaired tone,Decreased mobility,Decreased balance,Impaired flexibility,Increased muscle spasms  Visit Diagnosis: Other symptoms and signs involving the nervous system  Muscle spasm of back  Muscle weakness (generalized)     Problem List Patient Active Problem List   Diagnosis Date Noted  . Wheelchair dependence 07/21/2020  . Paraplegia (HCC) 05/22/2020  . Spasticity 05/22/2020    DildayDonavan Burnet, PT 11/01/2020, 4:57 PM  [facility] [address] [place], [place], [address] Phone: [phone]   Fax:  [phone]  Name: Devin Collins MRN: [id number] Date of Birth: [birth year]

## 2020-11-02 ENCOUNTER — Ambulatory Visit: Payer: Medicare Other | Admitting: Physical Therapy

## 2020-11-07 ENCOUNTER — Ambulatory Visit: Payer: Medicare Other | Attending: Physical Medicine and Rehabilitation | Admitting: Physical Therapy

## 2020-11-07 ENCOUNTER — Other Ambulatory Visit: Payer: Self-pay

## 2020-11-07 DIAGNOSIS — M6281 Muscle weakness (generalized): Secondary | ICD-10-CM | POA: Diagnosis present

## 2020-11-07 DIAGNOSIS — R29818 Other symptoms and signs involving the nervous system: Secondary | ICD-10-CM | POA: Diagnosis present

## 2020-11-07 DIAGNOSIS — M62830 Muscle spasm of back: Secondary | ICD-10-CM | POA: Diagnosis present

## 2020-11-07 NOTE — Therapy (Signed)
[facility] [address] [place], [place], [address] Phone: [phone]   Fax:  [phone]  Physical Therapy Treatment  Patient Details  Name: Devin Collins MRN: [id number] Date of Birth: Jan 16, 1981 Referring Provider (PT): Genice Rouge, MD   Encounter Date: 11/07/2020   PT End of Session - 11/08/20 2038    Visit Number 4    Number of Visits 13   eval + 2x/week x 4 weeks; 1x/ wk x 4 wks = 13 visits   Authorization Type Medicare/Medicaid    Authorization Time Period 10-12-20 - 01-10-21    PT Start Time 0805    PT Stop Time 0846    PT Time Calculation (min) 41 min    Equipment Utilized During Treatment Back brace   pt wearing lumbar support brace; used noodle and theraband   Activity Tolerance Patient tolerated treatment well    Behavior During Therapy [facility] for tasks assessed/performed           Past Medical History:  Diagnosis Date  . Arthritis   . Decubitus ulcer of sacral region, stage 4 (HCC) 10/03/2017  . Post-traumatic paraplegia 2004  . Scoliosis   . 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[facility])     Past Surgical History:  Procedure Laterality Date  . COSMETIC SURGERY  10/05/2018   flap closure sacral wound  . IVC FILTER INSERTION    . posterior lumbar laminectomy and fusion  12/03/2018  . SPINE SURGERY  11/09/2018   lumbar fusion   . THORACIC FUSION     T10-11  . THORACIC LAMINECTOMY  08/2005    There were no vitals filed for this visit.   Self care;  Discussed pt's status and feelings of discomfort due to spasticity:  Pt feels MRI is needed  When knees are together he feels muscles in hips are spreading  When knees are apart - it feels like muscles in groin are tight - goes back to spine     TherEx:  Passive stretching:  Pt transferred from wheelchair to mat; sitting to supine with lower legs hanging off edge of mat;   Bil. Hip flexor tightness prevented posterior thighs from touching mat   Following  was noted with pt supine on mat;  Lt pelvis elevated compared to Rt side;  Slight rotation with Rt pelvis more anterior  Bil. Hip flexor tightness: Lt hip flexed 22 degrees:  Rt hip flexed 15 degrees (due to spasticity in bil. Hip flexors)  Pt's legs were pushed into hip extension by PT for hip flexor stretching and an audible "pop" was heard (when legs were pushed down)   RLE positioned in 20 degrees abduction:  LLE in 15 degrees abduction (when pt lying supine on mat)  With stretching Lt hip flexors it was noted that Rt hip elevates; pt reported feeling a stretch or pull in muscles in his back and states "it crosses over"                          PT Short Term Goals - 11/08/20 2044      PT SHORT TERM GOAL #1   Title Pt will increase Rt shoulder flexion to >/= 155 degrees and Lt shoulder flexion to >/= 160 degrees to enable increased independence with ADL's such as upper body dressing and washing hair.    Baseline Rt shoulder flexion 134;  Lt shoulder flexion 140    Time 4    Period  Weeks    Status New    Target Date 11/10/20      PT SHORT TERM GOAL #2   Title Pt will improve sitting balance so pt is able to reach forward at least 10" with LUE without RUE support for balance.    Baseline LUE reach 8";  RUE reach 11"    Time 4    Period Weeks    Status New    Target Date 11/10/20      PT SHORT TERM GOAL #3   Title Pt will subjectively report at least 25% improvement in pain and discomfort due to spasticity in LE's and trunk.    Time 4    Period Weeks    Status New    Target Date 11/10/20      PT SHORT TERM GOAL #4   Title Independent in HEP for stretching exs for LE's and trunk musculature.    Time 4    Period Weeks    Status New    Target Date 11/10/20             PT Long Term Goals - 11/08/20 2044      PT LONG TERM GOAL #1   Title Pt will subjectively report at least 50% improvement in spasticity/tightness in LE's and in trunk for increased  independence and ease with transfers and ADL's such as dressing & grooming.    Time 8    Period Weeks    Status New      PT LONG TERM GOAL #2   Title Pt will report ability to independently load his wheelchair into car to allow independence with return to driving.    Time 8    Period Weeks    Status New      PT LONG TERM GOAL #3   Title Pt will increase strength in bil. Shoulder flexors to at least 4+/5 to increase independence with lifting wheelchair in and out of car.    Time 8    Period Weeks    Status New      PT LONG TERM GOAL #4   Title Independent in updated HEP for stretching and UE strengthening.    Time 8    Period Weeks    Status New                 Plan - 11/08/20 2039    Clinical Impression Statement Session focused on stretching LE's to increase symmetry in pelvis with reduced rotation and also hip flexor stretching.  Pt's LE's flexed approx. 20-30 degrees with pt lying supine due to spasticity in bil. hip flexors.  LE's did partially relax with moderate force into extension with pt supine.  Increased hip flexor tightness noted in today's session compared to that in last week's session, in which greater pelvic rotation was noted.  Pt did report he felt better at end of session, after stretching was performed.  Pt on hold until MRI is completed  - pt is scheduled to see Dr. Alice Reichert on 11-17-20.    Personal Factors and Comorbidities Comorbidity 2;Past/Current Experience;Time since onset of injury/illness/exacerbation    Comorbidities T10 SCI with paraplegia, h/o cervical fusion 06-08-20, surgical site infection with hospital readmission 06-19-20 for I&D, h/o lumbar fusion Feb. 2020, h/o flap surgery stage 3 Dec. 2019    Examination-Activity Limitations Bed Mobility;Bend;Dressing;Squat;Bathing;Lift;Stand;Locomotion Level;Transfers;Stairs    Examination-Participation Restrictions Cleaning;Meal Prep;Yard Work;Driving;Laundry;Shop;Community Activity    Stability/Clinical  Decision Making Evolving/Moderate complexity    Rehab Potential Good  PT Frequency 2x / week   decr. to 1x/ x 4 weeks after 1st month   PT Duration 4 weeks    PT Treatment/Interventions ADLs/Self Care Home Management;Therapeutic exercise;Therapeutic activities;Functional mobility training;Patient/family education;Neuromuscular re-education;Balance training;Passive range of motion;Manual techniques;Dry needling    PT Next Visit Plan check stretching HEP for LE's and trunk - Medbridge;    PT Home Exercise Plan Medbridge [id number]    Consulted and Agree with Plan of Care Patient;Family member/caregiver    Family Member Consulted wife [name]           Patient will benefit from skilled therapeutic intervention in order to improve the following deficits and impairments:  Decreased strength,Decreased range of motion,Impaired tone,Decreased mobility,Decreased balance,Impaired flexibility,Increased muscle spasms  Visit Diagnosis: Other symptoms and signs involving the nervous system  Muscle weakness (generalized)  Muscle spasm of back     Problem List Patient Active Problem List   Diagnosis Date Noted  . Wheelchair dependence 07/21/2020  . Paraplegia (HCC) 05/22/2020  . Spasticity 05/22/2020    DildayDonavan Burnet, PT 11/08/2020, 8:46 PM  [facility] [address] [place], [place], [address] Phone: [phone]   Fax:  [phone]  Name: Devin Collins MRN: [id number] Date of Birth: Jul 18, 1981

## 2020-11-09 ENCOUNTER — Ambulatory Visit: Payer: Medicare Other | Admitting: Physical Therapy

## 2020-11-14 ENCOUNTER — Ambulatory Visit: Payer: Medicare Other | Admitting: Physical Therapy

## 2020-11-16 ENCOUNTER — Ambulatory Visit: Payer: Medicare Other | Admitting: Physical Therapy

## 2020-11-17 ENCOUNTER — Other Ambulatory Visit: Payer: Self-pay

## 2020-11-17 ENCOUNTER — Encounter
Payer: Medicare Other | Attending: Physical Medicine and Rehabilitation | Admitting: Physical Medicine and Rehabilitation

## 2020-11-17 ENCOUNTER — Encounter: Payer: Self-pay | Admitting: Physical Medicine and Rehabilitation

## 2020-11-17 VITALS — BP 129/72 | HR 90 | Temp 98.1°F | Ht 74.0 in | Wt 162.2 lb

## 2020-11-17 DIAGNOSIS — G8220 Paraplegia, unspecified: Secondary | ICD-10-CM | POA: Diagnosis present

## 2020-11-17 DIAGNOSIS — R2689 Other abnormalities of gait and mobility: Secondary | ICD-10-CM | POA: Diagnosis present

## 2020-11-17 DIAGNOSIS — R252 Cramp and spasm: Secondary | ICD-10-CM | POA: Insufficient documentation

## 2020-11-17 DIAGNOSIS — Z993 Dependence on wheelchair: Secondary | ICD-10-CM | POA: Insufficient documentation

## 2020-11-17 DIAGNOSIS — N319 Neuromuscular dysfunction of bladder, unspecified: Secondary | ICD-10-CM | POA: Diagnosis present

## 2020-11-17 NOTE — Progress Notes (Signed)
Pt is a 40 yr old male with hx of T5 paraplegia s/p cervical stenosis/posterior fusion of cervical spine, with spasticity, neurogenic bowel and bladder, injury in 2004; and myofascial pain. Here for trigger point injections.   Also having a lot of "pops" in muscles Moving up down hamstrings, achilles, tendons in ankles.   Working out 4 days/week- 45 minutes at a time. But not getting chest and abdominal muscles aren't stronger- is T5- feels like "balance" muscles are tight/like  A snake,   Butt also hurts all the time.   Got Theracane- helps a little.  Focused on a lot spasticity/muscles popping.   Also balance is poor   Wearing leg bag- needs Botox of bladder.   Exam: Awake, in manual w/c, accompanied by [name]- wife, NAD Sitting balance poor- needs to prop now - which is new HF and KE/KF are very tight- MAS of 2-3, which is new for him-  Sensory level is T5 still used ot be T9 10 years ago.  But also has numbness in L hand- all 5 fingers MS: UEs- biceps, triceps, WE, grip 5/5 , but L finger abd 4/5, and R finger abd 4+/5- so weaker than expected  Assessment  Pt is a 40 yr old male with hx of T5 paraplegia s/p cervical stenosis/posterior fusion of cervical spine, with spasticity, neurogenic bowel and bladder, injury in 2004; and myofascial pain. Here for trigger point injections. 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    Plan: 1. Cervical and Thoracic MRI- had Cervical MRI last year. But concerned surgery might have messed with syrinx/shunt placement???  2. Hx of syrinx with shunt placement at T6- concern that this is what's going on with increased spasticity and loss of balance- and pain. Seeing NEW spasticity and loss of balance which is VERY new for pt- he, not too long ago, could pick up basketball from floor without using hands to support self. Very concerned problem with syrinx again. Esp with C8 -T1 weakness, needs MRI of thoracic and Cervical MRI.   3. Patient here for  trigger point injections for  Consent done and on chart.  Cleaned areas with alcohol and injected using a 27 gauge 1.5 inch needle  Injected  Using 1% Lidocaine with no EPI  Upper traps B/L Levators B/L  Posterior scalenes B/L x2 Middle scalenes Splenius Capitus BL x2 Pectoralis Major B/L Rhomboids B/L  Infraspinatus Teres Major/minor Thoracic paraspinals B/L x2 Lumbar paraspinals Other injections-    Patient's level of pain prior was 6/10 Current level of pain after injections is 3-4/10  After injections  There was no bleeding or complications.  Patient was advised to drink a lot of water on day after injections to flush system Will have increased soreness for 12-48 hours after injections.  Can use Lidocaine patches the day AFTER injections Can use theracane on day of injections in places didn't inject Can use heating pad 4-6 hours AFTER injections  4. F/U by phone after MRI  5. F/U in person 6-8 weeks double appointment  6. Urology Dr Marry Guan- [facility]-  Has called to get Botox done of bladder. Is only holding 350cc and then overflowing- so needs Botox- has had in past.    I spent a total of 35 minutes on visit- as detailed above.

## 2020-11-17 NOTE — Patient Instructions (Signed)
Pt is a 40 yr old male with hx of T5 paraplegia s/p cervical stenosis/posterior fusion of cervical spine, with spasticity, neurogenic bowel and bladder, injury in 2004; and myofascial pain. Here for trigger point injections. 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    Plan: 1. Thoracic MRI- had Cervical MRI last year.   2. Hx of syrinx with shunt placement at T6- concern that this is what's going on. Seeing NEW spasticity and loss of balance which is VERY new for pt- he, not too long ago, could pick up basketball from floor without using hands to support self.   3. Patient here for trigger point injections for  Consent done and on chart.  Cleaned areas with alcohol and injected using a 27 gauge 1.5 inch needle  Injected  Using 1% Lidocaine with no EPI  Upper traps B/L Levators B/L  Posterior scalenes B/L x2 Middle scalenes Splenius Capitus BL x2 Pectoralis Major B/L Rhomboids B/L  Infraspinatus Teres Major/minor Thoracic paraspinals B/L x2 Lumbar paraspinals Other injections-    Patient's level of pain prior was 6/10 Current level of pain after injections is 3-4/10  After injections  There was no bleeding or complications.  Patient was advised to drink a lot of water on day after injections to flush system Will have increased soreness for 12-48 hours after injections.  Can use Lidocaine patches the day AFTER injections Can use theracane on day of injections in places didn't inject Can use heating pad 4-6 hours AFTER injections  4. F/U by phone after MRI  5. F/U in person 6-8 weeks double appointment  6. Urology Dr Marry Guan- [facility]-  Has called to get Botox done of bladder.

## 2020-11-19 ENCOUNTER — Other Ambulatory Visit: Payer: Self-pay

## 2020-11-19 ENCOUNTER — Ambulatory Visit (INDEPENDENT_AMBULATORY_CARE_PROVIDER_SITE_OTHER): Payer: Medicare Other

## 2020-11-19 DIAGNOSIS — R252 Cramp and spasm: Secondary | ICD-10-CM

## 2020-11-19 DIAGNOSIS — G8220 Paraplegia, unspecified: Secondary | ICD-10-CM

## 2020-11-19 DIAGNOSIS — R2689 Other abnormalities of gait and mobility: Secondary | ICD-10-CM

## 2020-11-19 DIAGNOSIS — M488X3 Other specified spondylopathies, cervicothoracic region: Secondary | ICD-10-CM

## 2020-11-19 DIAGNOSIS — R937 Abnormal findings on diagnostic imaging of other parts of musculoskeletal system: Secondary | ICD-10-CM | POA: Diagnosis not present

## 2020-11-19 DIAGNOSIS — Z8669 Personal history of other diseases of the nervous system and sense organs: Secondary | ICD-10-CM

## 2020-12-21 ENCOUNTER — Ambulatory Visit: Payer: Medicare Other | Attending: Physical Medicine and Rehabilitation | Admitting: Physical Therapy

## 2021-01-05 ENCOUNTER — Ambulatory Visit
Admission: RE | Admit: 2021-01-05 | Discharge: 2021-01-05 | Disposition: A | Discharge status: Home / Self Care | Payer: Medicare Other | Source: Ambulatory Visit | Attending: Physical Medicine and Rehabilitation | Admitting: Physical Medicine and Rehabilitation

## 2021-01-05 ENCOUNTER — Other Ambulatory Visit: Payer: Self-pay

## 2021-01-05 ENCOUNTER — Encounter: Payer: Self-pay | Admitting: Physical Medicine and Rehabilitation

## 2021-01-05 ENCOUNTER — Encounter
Payer: Medicare Other | Attending: Physical Medicine and Rehabilitation | Admitting: Physical Medicine and Rehabilitation

## 2021-01-05 VITALS — BP 116/61 | HR 101 | Temp 98.5°F | Ht 72.0 in | Wt 165.0 lb

## 2021-01-05 DIAGNOSIS — M25551 Pain in right hip: Secondary | ICD-10-CM

## 2021-01-05 DIAGNOSIS — E559 Vitamin D deficiency, unspecified: Secondary | ICD-10-CM | POA: Insufficient documentation

## 2021-01-05 DIAGNOSIS — Z993 Dependence on wheelchair: Secondary | ICD-10-CM | POA: Insufficient documentation

## 2021-01-05 DIAGNOSIS — R5383 Other fatigue: Secondary | ICD-10-CM | POA: Insufficient documentation

## 2021-01-05 DIAGNOSIS — R2689 Other abnormalities of gait and mobility: Secondary | ICD-10-CM | POA: Diagnosis present

## 2021-01-05 DIAGNOSIS — M25552 Pain in left hip: Secondary | ICD-10-CM | POA: Diagnosis present

## 2021-01-05 DIAGNOSIS — M7918 Myalgia, other site: Secondary | ICD-10-CM | POA: Insufficient documentation

## 2021-01-05 DIAGNOSIS — G8220 Paraplegia, unspecified: Secondary | ICD-10-CM | POA: Diagnosis present

## 2021-01-05 MED ORDER — DIAZEPAM 5 MG PO TABS: 5.0000 mg | ORAL_TABLET | Freq: Three times a day (TID) | ORAL | 5 refills | Status: DC

## 2021-01-05 MED ORDER — DULOXETINE HCL 30 MG PO CPEP: 30.0000 mg | ORAL_CAPSULE | Freq: Every day | ORAL | 5 refills | Status: DC

## 2021-01-05 NOTE — Patient Instructions (Addendum)
Plan: 1. WBC is 13.5- ESR 57; CRP- 21.3-  in PO ABX- for wound infection had of cervical spine infection after fusion. Could be elevated because sacral wound, but would also check   2. Patient here for trigger point injections for  Consent done and on chart.  Cleaned areas with alcohol and injected using a 27 gauge 1.5 inch needle  Injected  Using 1% Lidocaine with no EPI  Upper traps B/L  Levators B/L Posterior scalenes B/L x2 Middle scalenes Splenius Capitus B/L x2 Pectoralis Major Rhomboids B/L x2 Infraspinatus Teres Major/minor Thoracic paraspinals B/L x2 Lumbar paraspinals Other injections- piriformis/buttocks B/L   Patient's level of pain prior was Current level of pain after injections is  There was no bleeding or complications.  Patient was advised to drink a lot of water on day after injections to flush system Will have increased soreness for 12-48 hours after injections.  Can use Lidocaine patches the day AFTER injections Can use theracane on day of injections in places didn't inject Can use heating pad 4-6 hours AFTER injections  3. Already on 6 Percocet/day by Dr Mechele Dawley- however it's not real helpful.   4. Vit D level -will check labs because myofascial pain is so bad and tired/lethargic all the time- could be contributing.   5. Duloxetine 30 mg QHS x 1 week, the 60 mg nightly- for pain-   6. If doesn't work, will try Low dose naltrexone.   7. Called Dr Hulan Saas to see if he has any other recommendations to help.  L/M  8. Guarantees for safety at this time- will call me if feels more depressed.   9. F/U in 6-8 weeks double appointment. SCI/trigger point injections 10. B/L hip xrays- to make sur enot out of socket-  At [place].

## 2021-01-05 NOTE — Progress Notes (Signed)
Pt is a 40 yr old male with hx of T5 paraplegia s/p cervical stenosis/posterior fusion of cervical spine, with spasticity, neurogenic bowel and bladder, injury in 2004; and myofascial pain. Here for f/u as well as trigger point injections.    Shunt for syrinx has been broken since 2016. Even though broken, is working somehow. Also told that had a rod which was broken has fused in place in the bone.    When raises eyebrows- can feel muscles on head roll. Goes right down throat. Goes down the back of lats.  When raises 1 arm, feels like feels like muscle down lateral aspect of ribs will "pop" When spread legs, can feel underneath hips, knot will pop and run up and back to hips/buttocks.  Crunches under buttocks-   Hasn't thought of killing self until last 2 weeks.Really feels like this is the worst thing he's ever felt.   Pain is constant- all day-. Says he feels that muscles are rolling, moving, feels out of place- don't feel stable.  Also occurs in low back- feels like sitting on big ball. When pops helps pain, but lasts 30-60 minutes and then back.   Feels tight sometimes, but also can be super loose.   Not sure Vit D level has been checked.  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   Exam: Awake, alert, depressed affect, NAD Accompanied by fiance' Myofascial trigger points palpated everywhere- every where I can check.  Low tone in LEs- not spasticity.   Plan: 1. WBC is 13.5- ESR 57; CRP- 21.3-  in PO ABX- for wound infection had of cervical spine infection after fusion. Could be elevated because sacral wound, but would also check   2. Patient here for trigger point injections for  Consent done and on chart.  Cleaned areas with alcohol and injected using a 27 gauge 1.5 inch needle  Injected  Using 1% Lidocaine with no EPI  Upper traps B/L  Levators B/L Posterior scalenes B/L x2 Middle  scalenes Splenius Capitus B/L x2 Pectoralis Major Rhomboids B/L x2 Infraspinatus Teres Major/minor Thoracic paraspinals B/L x2 Lumbar paraspinals Other injections- piriformis/buttocks B/L   Patient's level of pain prior was Current level of pain after injections is  There was no bleeding or complications.  Patient was advised to drink a lot of water on day after injections to flush system Will have increased soreness for 12-48 hours after injections.  Can use Lidocaine patches the day AFTER injections Can use theracane on day of injections in places didn't inject Can use heating pad 4-6 hours AFTER injections  3. Already on 6 Percocet/day by Dr Mechele Dawley- however it's not real helpful.   4. Vit D level -will check labs because myofascial pain is so bad and tired/lethargic all the time- could be contributing.   5. Duloxetine 30 mg QHS x 1 week, the 60 mg nightly- for pain-   6. If doesn't work, will try Low dose naltrexone.   7. Called Dr Hulan Saas to see if he has any other recommendations to help.  L/M  8. Guarantees for safety at this time- will call me if feels more depressed.   9. F/U in 6-8 weeks double appointment. SCI/trigger point injections  I spent a total of 45 minutes on visit- 35 minutes on visit- 10 minutes on injections.  Discussing options for treating myofascial pain

## 2021-01-06 LAB — VITAMIN D 25 HYDROXY (VIT D DEFICIENCY, FRACTURES): Vit D, 25-Hydroxy: 39.8 ng/mL (ref 30.0–100.0)

## 2021-01-30 ENCOUNTER — Telehealth: Payer: Self-pay | Admitting: Physical Medicine and Rehabilitation

## 2021-01-30 DIAGNOSIS — G8220 Paraplegia, unspecified: Secondary | ICD-10-CM

## 2021-01-30 DIAGNOSIS — G249 Dystonia, unspecified: Secondary | ICD-10-CM

## 2021-01-30 NOTE — Telephone Encounter (Signed)
Pt's muscle rolling is getting worse- could be dystonia? Not sure why- will get brain MRI  Also has fractured rod on thoracic MRI- need to look at end/tip on lumbar spine.   Cymbalta is helping some- makes pain less for muscle rolling.   Placed referrals for both brain MRI and lumbar MRIs.   Trigger point injections aren't helping- nothing is helping.  Lasted 2 days- doing theracane- massage gun makes it worse.

## 2021-02-03 ENCOUNTER — Other Ambulatory Visit: Payer: Self-pay

## 2021-02-03 ENCOUNTER — Ambulatory Visit (INDEPENDENT_AMBULATORY_CARE_PROVIDER_SITE_OTHER): Payer: Medicare Other

## 2021-02-03 DIAGNOSIS — G8220 Paraplegia, unspecified: Secondary | ICD-10-CM | POA: Diagnosis not present

## 2021-02-03 DIAGNOSIS — G249 Dystonia, unspecified: Secondary | ICD-10-CM

## 2021-03-02 ENCOUNTER — Encounter: Payer: Self-pay | Admitting: Physical Medicine and Rehabilitation

## 2021-03-02 ENCOUNTER — Other Ambulatory Visit: Payer: Self-pay

## 2021-03-02 ENCOUNTER — Encounter
Payer: Medicare Other | Attending: Physical Medicine and Rehabilitation | Admitting: Physical Medicine and Rehabilitation

## 2021-03-02 VITALS — BP 121/75 | HR 80 | Temp 97.7°F | Ht 72.0 in | Wt 157.0 lb

## 2021-03-02 DIAGNOSIS — R531 Weakness: Secondary | ICD-10-CM

## 2021-03-02 DIAGNOSIS — R252 Cramp and spasm: Secondary | ICD-10-CM

## 2021-03-02 DIAGNOSIS — M7918 Myalgia, other site: Secondary | ICD-10-CM

## 2021-03-02 DIAGNOSIS — G8220 Paraplegia, unspecified: Secondary | ICD-10-CM

## 2021-03-02 MED ORDER — LEVETIRACETAM 500 MG PO TABS: 500.0000 mg | ORAL_TABLET | Freq: Two times a day (BID) | ORAL | 5 refills | Status: DC
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
## 2021-05-04 ENCOUNTER — Encounter
Payer: Medicare Other | Attending: Physical Medicine and Rehabilitation | Admitting: Physical Medicine and Rehabilitation

## 2021-05-04 DIAGNOSIS — G8220 Paraplegia, unspecified: Secondary | ICD-10-CM | POA: Insufficient documentation

## 2021-05-04 DIAGNOSIS — R252 Cramp and spasm: Secondary | ICD-10-CM | POA: Insufficient documentation

## 2021-05-04 DIAGNOSIS — M7918 Myalgia, other site: Secondary | ICD-10-CM | POA: Insufficient documentation

## 2021-05-04 DIAGNOSIS — R531 Weakness: Secondary | ICD-10-CM | POA: Insufficient documentation

## 2021-05-31 ENCOUNTER — Ambulatory Visit: Payer: Medicare Other | Admitting: Neurology

## 2021-06-05 ENCOUNTER — Ambulatory Visit (INDEPENDENT_AMBULATORY_CARE_PROVIDER_SITE_OTHER): Payer: Medicare Other | Admitting: Neurology

## 2021-06-05 ENCOUNTER — Other Ambulatory Visit: Payer: Self-pay

## 2021-06-05 ENCOUNTER — Encounter: Payer: Self-pay | Admitting: Neurology

## 2021-06-05 VITALS — BP 141/75 | HR 106

## 2021-06-05 DIAGNOSIS — G950 Syringomyelia and syringobulbia: Secondary | ICD-10-CM | POA: Diagnosis not present

## 2021-06-05 DIAGNOSIS — T1490XS Injury, unspecified, sequela: Secondary | ICD-10-CM
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
## 2021-06-06 ENCOUNTER — Telehealth: Payer: Self-pay | Admitting: Neurology

## 2021-06-06 NOTE — Telephone Encounter (Signed)
Sent referral to [facility] ph # [phone]. 

## 2021-06-06 NOTE — Telephone Encounter (Signed)
Received charge sheet and signed patient consent (given to medical records) for 600 units of Xeomin (X2119) for paraplegia (G82.20). CPT [id number], [id number], [id number], [id number], [id number], [id number], [phone]. Patient has Medicare A & B as primary, PA is not required. I called patient and LVM requesting a call back to discuss/schedule appointment.

## 2021-07-30 ENCOUNTER — Other Ambulatory Visit: Payer: Self-pay

## 2021-07-30 ENCOUNTER — Encounter
Payer: Medicare Other | Attending: Physical Medicine and Rehabilitation | Admitting: Physical Medicine and Rehabilitation

## 2021-07-30 ENCOUNTER — Encounter: Payer: Self-pay | Admitting: Physical Medicine and Rehabilitation

## 2021-07-30 VITALS — BP 125/65 | HR 103 | Ht 72.0 in

## 2021-07-30 DIAGNOSIS — G8220 Paraplegia, unspecified: Secondary | ICD-10-CM | POA: Insufficient documentation

## 2021-07-30 DIAGNOSIS — M436 Torticollis: Secondary | ICD-10-CM | POA: Insufficient documentation

## 2021-07-30 DIAGNOSIS — G950 Syringomyelia and syringobulbia: Secondary | ICD-10-CM | POA: Diagnosis present

## 2021-07-30 MED ORDER — DIAZEPAM 5 MG PO TABS: 5.0000 mg | ORAL_TABLET | Freq: Three times a day (TID) | ORAL | 5 refills | Status: DC

## 2021-07-30 NOTE — Patient Instructions (Addendum)
Pt is a 40 yr old male with hx of T5 paraplegia (used to be T9 but had previous syrinx)- s/p cervical stenosis/posterior fusion of cervical spine; with spasticity, neurogenic bowel and bladder and myofascial pain.  Also has unusual sensations/spasms-. Here for f/u on SCI. 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 Anterocollis > torticollis with chin to Left. Likely the cause of his muscle spasms  Patient here for trigger point injections for  Consent done and on chart.  Cleaned areas with alcohol and injected using a 27 gauge 1.5 inch needle  Injected 3cc Using 1% Lidocaine with no EPI  Upper traps  Levators- B/L  Posterior scalenes- B/L  Middle scalenes Splenius Capitus Pectoralis Major- B/L  Rhomboids Infraspinatus Teres Major/minor Thoracic paraspinals Lumbar paraspinals Other injections- SCMs B/L - multiple twitch responses in all the muscles injected  Patient's level of pain prior was 7/10 Current level of pain after injections is- down to 3-4/10- feels great  There was no bleeding or complications.  Patient was advised to drink a lot of water on day after injections to flush system Will have increased soreness for 12-48 hours after injections.  Can use Lidocaine patches the day AFTER injections Can use theracane on day of injections in places didn't inject Can use heating pad 4-6 hours AFTER injections  3. Refill Valium 5 mg TID with 5 refills- pain meds through Dr Roderic Ovens.    4. Con't Baclofen, Zanaflex and Gabapentin - via PCP-   5. F/U asap for Botox- will get pre-authorization. As well as double appt in 3 months  6.  Needs to call [facility] to see how old w/c is- cushion is a ROHO 4 chambers- is 40 year old. So next appointment we will focus on the w/c.

## 2021-07-30 NOTE — Progress Notes (Signed)
Subjective:    Patient ID: Devin Collins, male    DOB: 12-03-1980, 40 y.o.   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  Also has unusual sensations/spasms-. Here for f/u on SCI.   Still  having pain in neck so bad- is actually turning his head to Right.  Is the "muscle in the front of neck" both sides- likely  due to neck fusion last year, because pain started afterwards.   Feels pain down into buttocks and inner legs.  Has appointment with Ortho to do tendon release- adductor tendons to loosen up and spread legs more- this is scheduled 08/10/21.    Only issues are these muscle issues.    Skin well- no breakdowns.   Has had a few [place]- [place] was [place], but nothing really hurt- a boo- boo on L knee.   Little tingle in L arm- esp L hand- goes numb all the time.  TO get in touch with [facility].    Pain Inventory Average Pain 7 Pain Right Now 5 My pain is constant, sharp, stabbing, tingling, and aching  LOCATION OF PAIN  head, neck, shoulder, elbow, wrist, hand, back, buttocks, groin, hip, thigh, knee, leg, ankle, toes  BOWEL Number of stools per week: 4-5 Oral laxative use Yes  Type of laxative Miralax  BLADDER Normal In and out cath, frequency self cath & digital stem 4 times daily Able to self cath Yes    Mobility do you drive?  yes use a wheelchair transfers alone  Function disabled: date disabled 2004  Neuro/Psych bladder control problems bowel control problems weakness numbness tingling trouble walking  Prior Studies Any changes since last visit?  no  Physicians involved in your care Any changes since last visit?  no   Family History  Problem Relation Age of Onset   Arthritis Mother    Hypertension Mother    Rheum arthritis Father    Arthritis Father     Hyperlipidemia Father    Heart attack Father    Social History   Socioeconomic History   Marital status: Significant Other    Spouse name: Not on file   Number of children: 1   Years of education: some college   Highest education level: Not on file  Occupational History   Not on file  Tobacco Use   Smoking status: Never   Smokeless tobacco: Current    Types: Snuff   Tobacco comments:    been using since 40 yo  Vaping Use   Vaping Use: Never used  Substance and Sexual Activity   Alcohol use: Yes    Alcohol/week: 7.0 standard drinks    Types: 7 Standard drinks or equivalent per week    Comment: one drink daily   Drug use: Yes    Frequency: 7.0 times per week    Types: Marijuana    Comment: smokes daily   Sexual activity: Not on file  Other Topics Concern   Not on file  Social History Narrative   Lives alone.   Right-handed.   Three glasses of sweet tea per day.   Social Determinants of Health   Financial Resource Strain: Not on file  Food Insecurity: Not on file  Transportation Needs: Not on file  Physical Activity: Not on file  Stress: Not  on file  Social Connections: Not on file   Past Surgical History:  Procedure Laterality Date   COSMETIC SURGERY  10/05/2018   flap closure sacral wound   IVC FILTER INSERTION     posterior lumbar laminectomy and fusion  12/03/2018   SPINE SURGERY  11/09/2018   lumbar fusion    THORACIC FUSION     T10-11   THORACIC LAMINECTOMY  08/2005   Past Medical History:  Diagnosis Date   Arthritis    Decubitus ulcer of sacral region, stage 4 (HCC) 10/03/2017   Post-traumatic paraplegia 2004   Scoliosis    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(HCC)    BP 125/65   Pulse (!) 103   Ht 6' (1.829 m)   SpO2 96%   BMI 21.29 kg/m   Opioid Risk Score:   Fall Risk Score:  `1  Depression screen PHQ 2/9  Depression screen [facility] 2/9 03/02/2021 11/17/2020 05/22/2020  Decreased Interest - 0 0  Down, Depressed, Hopeless 0 0 1  PHQ - 2 Score 0 0 1   Altered sleeping 0 - 2  Tired, decreased energy - - 1  Change in appetite - - 1  Feeling bad or failure about yourself  - - 2  Trouble concentrating - - 1  Moving slowly or fidgety/restless - - 0  Suicidal thoughts - - 0  PHQ-9 Score 0 - 8  Difficult doing work/chores - - Very difficult    Review of Systems  Cardiovascular:  Positive for leg swelling.       Right leg swelling  Gastrointestinal:  Positive for abdominal pain.  All other systems reviewed and are negative.     Objective:   Physical Exam Awake,alert, in manual w/c; accompanied by mother, NAD Severe trigger points in SCM B/L, less so in pecs, scalenes and levators- not in splenius capitus or upper traps.  Also getting anterocollis greater than torticollis. Pulls chin to Left slightly- and top of head to right.        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  Also has unusual sensations/spasms-. Here for f/u on SCI. 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 Anterocollis > torticollis with chin to Left. Likely the cause of his muscle spasms - start at 100 units for neck.  Patient here for trigger point injections for  Consent done and on chart.  Cleaned areas with alcohol and injected using a 27 gauge 1.5 inch needle  Injected 3cc Using 1% Lidocaine with no EPI  Upper traps  Levators- B/L  Posterior scalenes- B/L  Middle scalenes Splenius Capitus Pectoralis Major- B/L  Rhomboids Infraspinatus Teres Major/minor Thoracic paraspinals Lumbar paraspinals Other injections- SCMs B/L - multiple twitch responses in all the muscles injected  Patient's level of pain prior was 7/10 Current level of pain after injections is- down to 3-4/10- feels  great  There was no bleeding or complications.  Patient was advised to drink a lot of water on day after injections to flush system Will have increased soreness for 12-48 hours after injections.  Can use Lidocaine patches the day AFTER injections Can use theracane on day of injections in places didn't inject Can use heating pad 4-6 hours AFTER injections  3. Refill Valium 5 mg  TID with 5 refills- pain meds through Dr Roderic Ovens.    4. Con't Baclofen, Zanaflex and Gabapentin - via PCP-   5. F/U asap for Botox- will get pre-authorization.  6.  Needs to call [employer] to see how old w/c is- cushion is a ROHO 4 chambers- is 42 year old. So next appointment we will focus on the w/c.  I spent a total of 40 minutes on appointment- and 10 minutes doing trigger point injections.

## 2021-09-07 ENCOUNTER — Telehealth: Payer: Self-pay | Admitting: Physical Medicine and Rehabilitation

## 2021-09-07 NOTE — Telephone Encounter (Signed)
Left message for patient to arrive at [phone] for this appointment

## 2021-09-17 ENCOUNTER — Other Ambulatory Visit: Payer: Self-pay

## 2021-09-17 ENCOUNTER — Encounter
Payer: Medicare Other | Attending: Physical Medicine and Rehabilitation | Admitting: Physical Medicine and Rehabilitation

## 2021-09-17 ENCOUNTER — Encounter: Payer: Self-pay | Admitting: Physical Medicine and Rehabilitation

## 2021-09-17 VITALS — BP 107/69 | HR 98 | Temp 98.3°F | Ht 72.0 in

## 2021-09-17 DIAGNOSIS — G243 Spasmodic torticollis: Secondary | ICD-10-CM | POA: Diagnosis present

## 2021-09-17 DIAGNOSIS — Z993 Dependence on wheelchair: Secondary | ICD-10-CM

## 2021-09-17 DIAGNOSIS — M436 Torticollis: Secondary | ICD-10-CM

## 2021-09-17 DIAGNOSIS — G8220 Paraplegia, unspecified: Secondary | ICD-10-CM

## 2021-09-17 DIAGNOSIS — M7918 Myalgia, other site: Secondary | ICD-10-CM | POA: Diagnosis present

## 2021-09-17 NOTE — Progress Notes (Signed)
  Botulinum toxin injection for cervical dystonia CPT code 73419 Diagnosis code G 24.3 Indication is cervical dystonia- laterocollis questionable sagittal shift- and that has not responded to conservative care and interferes with activities of daily living as well as cervical range of motion. In addition to head tilt and L shoulder rising up which puts him off balance as a T4 paraplegic, he also has chronic cervical pain not relieved by other treatments.  Informed consent was obtained after describing risks and benefits of the procedure with the patient this included bleeding bruising and infection The patient elects to proceed and has given Written consent.  REMS form completed- specifically explained will take a process- 9-12 months to likely get best relief- and won't work perfectly immediately  Patient placed in a seated position A 30 gauge 1/2 inch needle was used      Botox 100 units to 1cc saline   Chin to L- top of head to R- R lateral-collis.  L shoulder up compared to R side.  Maybe a little sagittal shift, but will not target today.   Exam:  R SCM- very tight L levator- very tight.  Levator on L is tight as well as R SCM Not as tight in R levator- and L SCM not as tight.  B/L upper traps very tight Less splenius capitus- not as tight on palpation.  Middle scalenes B/L very tight and pulling.    Plan: Muscles and dosing: Treatment of cervical dystonia. Also treatment of Trigger points.  Injected 15 units into R SCM- upper third of muscle- used 30 gauge 1/2 inch needle  2. Injected 20 units into L Levator- used 30 gauge 1/2 inch needle.    3. Wasted 65 units of Botox since not used/needed  3. Patient here for trigger point injections for  Consent done and on chart.  Cleaned areas with alcohol and injected using a 27 gauge 1.5 inch needle  Injected 3cc Using 1% Lidocaine with no EPI  Upper traps- B/L  Levators- R only Posterior scalenes Middle scalenes  B/L  Splenius Capitus Pectoralis Major- B/L  Rhomboids- B/L  Infraspinatus Teres Major/minor Thoracic paraspinals Lumbar paraspinals Other injections-    Patient's level of pain prior was 8/10 Current level of pain after injections is- 5/10  There was no bleeding or complications.  Patient was advised to drink a lot of water on day after injections to flush system Will have increased soreness for 12-48 hours after injections.  Can use Lidocaine patches the day AFTER injections Can use theracane on day of injections in places didn't inject Can use heating pad 4-6 hours AFTER injections  5. Pt advised will take 5 days ot kick in and 2 weeks to hit maximal effect.   6. Take pics and let me know how things going around 2 week mark.   7. F/U in 3 months. For Botox. Double appt. Already has appt for 10/31/21 for w/c seating visit as well.   8. Showed arm pull stretch and pec pulling up stretch.   All injections done after negative drawback for blood. Patient tolerated procedure well. Post procedure instructions given. Follow up appointment made   I spent a total of 40 minutes on appt- 20 minute son injections and 20 minutes with pt and Botox rep figuring out plan.

## 2021-09-17 NOTE — Patient Instructions (Signed)
Botox 100 units to 1cc saline   Chin to L- top of head to R- R lateral-collis.  L shoulder up compared to R side.  Maybe a little sagittal shift, but will not target today.    R SCM- very tight L levator- very tight.  Levator on L is tight as well as R SCM Not as tight in R levator- and L SCM not as tight.  B/L upper traps very tight Less splenius capitus- not as tight on palpation.  Middle scalenes B/L very tight and pulling.    Plan: Treatment of cervical dystonia. Also treatment of Trigger points.  Injected 15 units into R SCM- upper third of muscle- used 30 gauge 1/2 inch needle  2. Injected 20 units into L Levator- used 30 gauge 1/2 inch needle.    3. Wasted 65 units of Botox since not used/needed  3. Patient here for trigger point injections for  Consent done and on chart.  Cleaned areas with alcohol and injected using a 27 gauge 1.5 inch needle  Injected 3cc Using 1% Lidocaine with no EPI  Upper traps- B/L  Levators- R only Posterior scalenes Middle scalenes B/L  Splenius Capitus Pectoralis Major- B/L  Rhomboids- B/L  Infraspinatus Teres Major/minor Thoracic paraspinals Lumbar paraspinals Other injections-    Patient's level of pain prior was 8/10 Current level of pain after injections is- 5/10  There was no bleeding or complications.  Patient was advised to drink a lot of water on day after injections to flush system Will have increased soreness for 12-48 hours after injections.  Can use Lidocaine patches the day AFTER injections Can use theracane on day of injections in places didn't inject Can use heating pad 4-6 hours AFTER injections  5. Pt advised will take 5 days ot kick in and 2 weeks to hit maximal effect.   6. Take pics and let me know how things going around 2 week mark.   7. F/U in 3 months. For Botox. Double appt.

## 2021-10-01 IMAGING — MR MR LUMBAR SPINE W/O CM
4 of 5 series · 26 of 48 positions shown · non-contrast
Comparison: None.

CLINICAL DATA: Spinal cord injury.  Pain and spasticity.

EXAM:
MRI LUMBAR SPINE WITHOUT CONTRAST
TECHNIQUE: Multiplanar, multisequence MR imaging of the lumbar spine was
performed. No intravenous contrast was administered.

[Series 4: T2 · sagittal · 4.0mm · 0.81mm/px · 6 of 17 slices shown (1 of 2)]
[im 1/17]
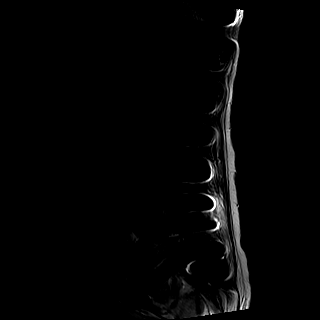
[im 4/17]
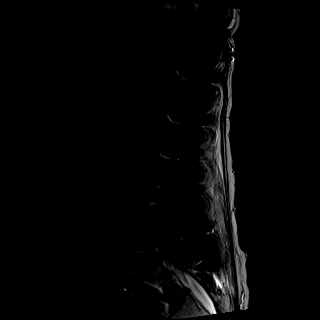
[im 7/17]
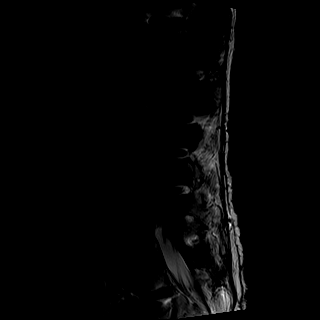
[im 10/17]
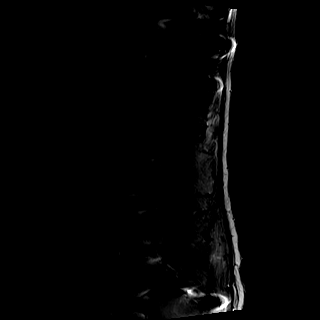
[im 13/17]
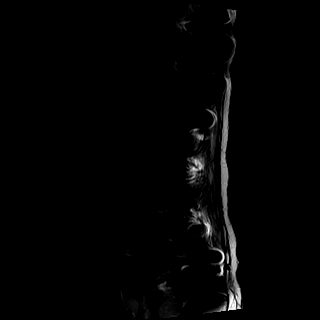
[im 17/17]
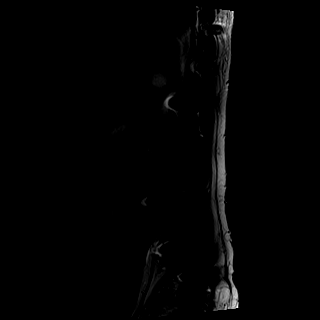

[Series 5: T1 · sagittal · 4.0mm · 0.41mm/px · 6 of 17 slices shown (1 of 2)]
[im 1/17]
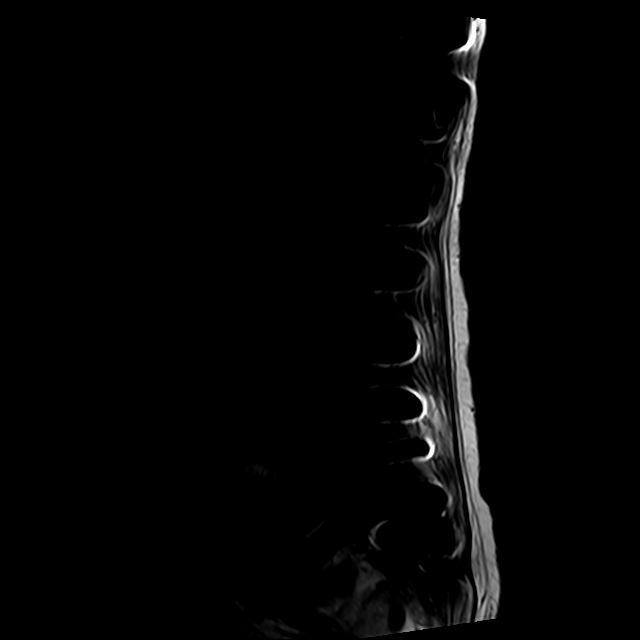
[im 4/17]
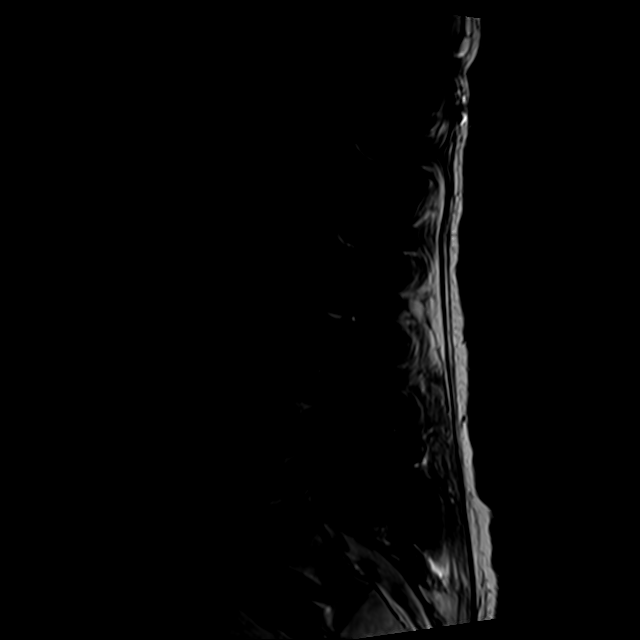
[im 7/17]
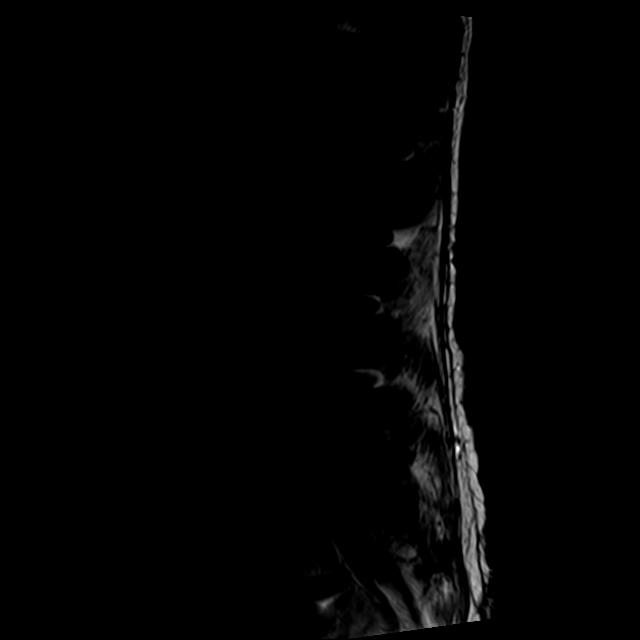
[im 10/17]
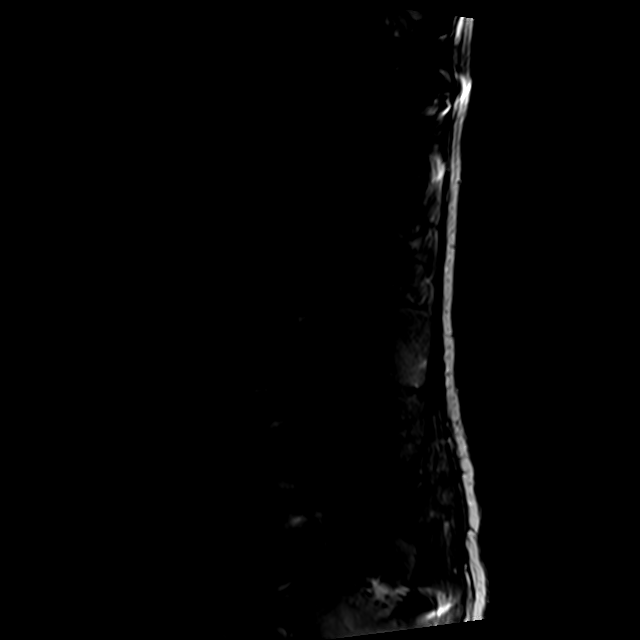
[im 13/17]
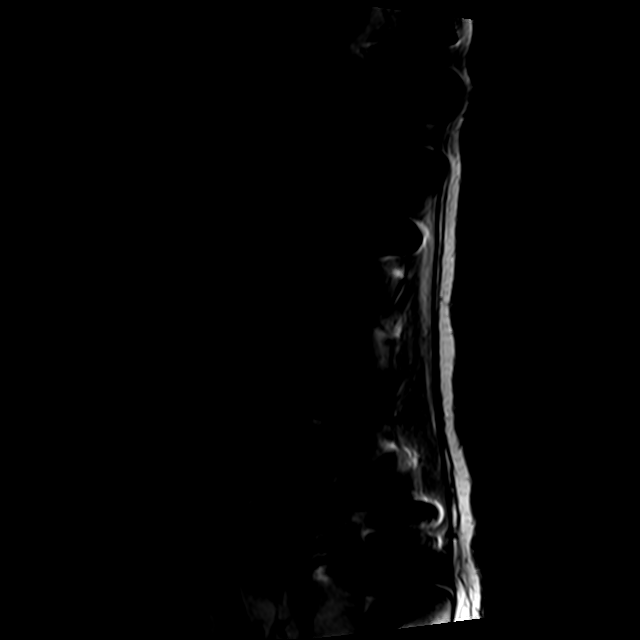
[im 17/17]
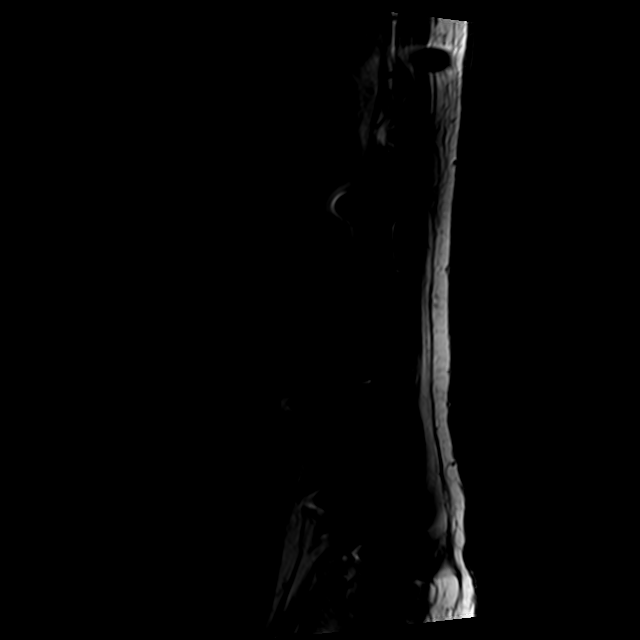

[Series 7: T2 · axial · 4.0mm · 0.78mm/px · z∈[-662,-410]mm · 9 of 44 slices shown (2 of 2)]
[im 1/44]
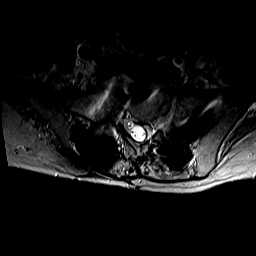
[im 7/44]
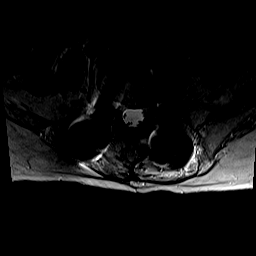
[im 13/44]
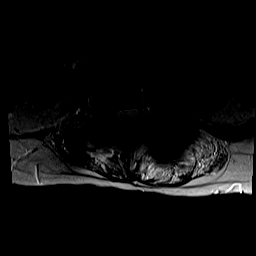
[im 19/44]
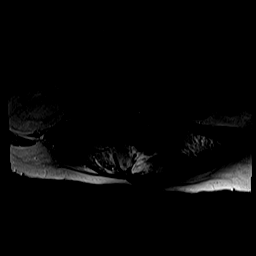
[im 22/44]
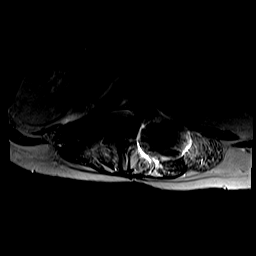
[im 25/44]
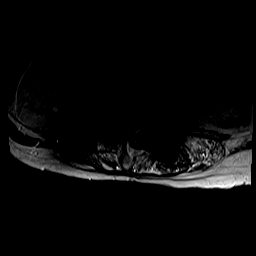
[im 31/44]
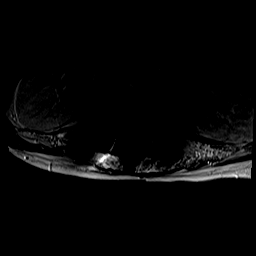
[im 37/44]
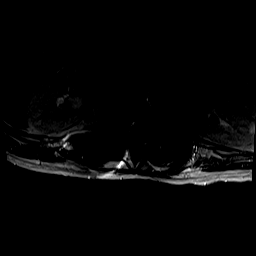
[im 44/44]
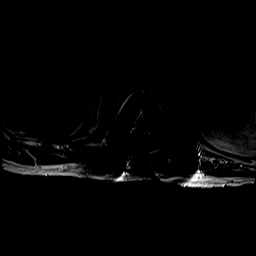

[Series 8: T1 · axial · 4.0mm · 0.39mm/px · z∈[-662,-444]mm · 5 of 44 slices shown (2 of 2)]
[im 1/44]
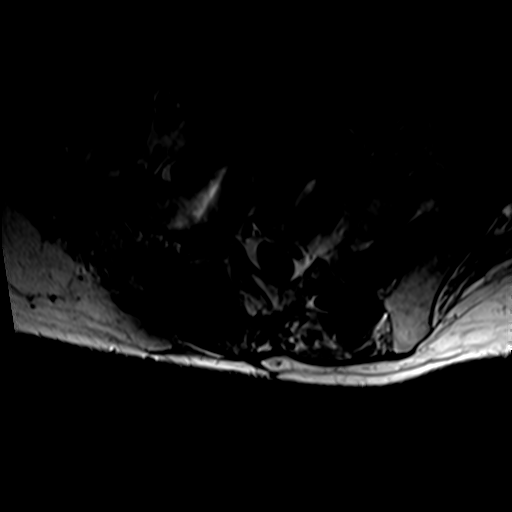
[im 7/44]
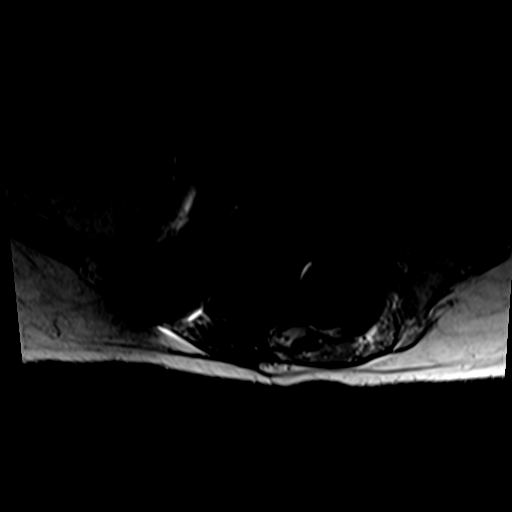
[im 13/44]
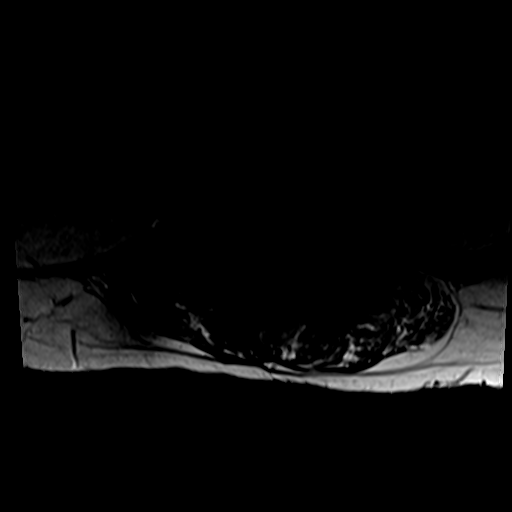
[im 22/44]
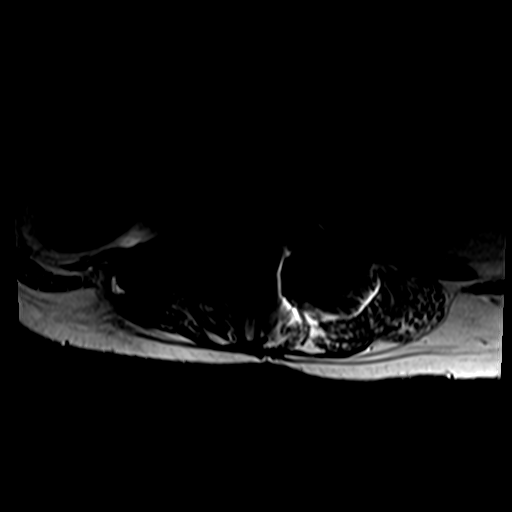
[im 37/44]
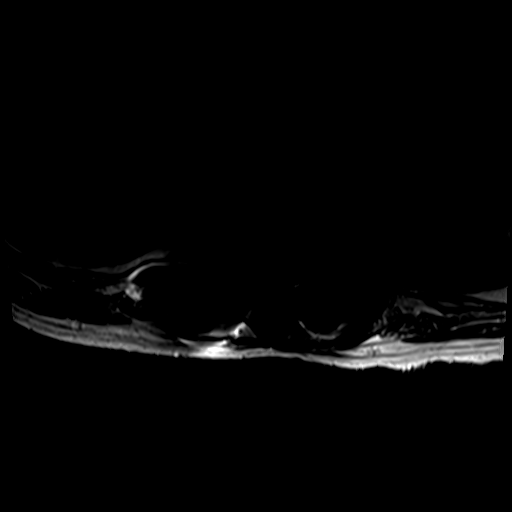

[26 of 48 positions shown; findings below may reference images not displayed]

FINDINGS: Segmentation:  5 lumbar type vertebral bodies assumed.

Alignment:  No malalignment.

Vertebrae: Fusion extending from the thoracic region to the sacrum.
Limited detail because of extensive artifact from the fusion
hardware.

Conus medullaris and cauda equina: Conus extends to the L2 level.

Paraspinal and other soft tissues: No abnormality seen.

Disc levels:

Extensive artifact from the fusion hardware. It appears that the
canal is widely patent. I do not see any foraminal lesion, but
detail is quite limited by the artifact.
IMPRESSION: Severely limited study because of artifact from the spinal fusion.
The spinal canal appears widely patent. I do not see any foraminal
lesion, but detail is quite limited.

## 2021-10-31 ENCOUNTER — Encounter: Payer: Medicare HMO | Attending: Physical Medicine and Rehabilitation | Admitting: Physical Medicine and Rehabilitation

## 2021-10-31 ENCOUNTER — Other Ambulatory Visit: Payer: Self-pay

## 2021-10-31 ENCOUNTER — Encounter: Payer: Self-pay | Admitting: Physical Medicine and Rehabilitation

## 2021-10-31 VITALS — BP 133/81 | HR 80 | Temp 97.7°F | Ht 72.0 in

## 2021-10-31 DIAGNOSIS — G5622 Lesion of ulnar nerve, left upper limb: Secondary | ICD-10-CM | POA: Insufficient documentation

## 2021-10-31 DIAGNOSIS — M436 Torticollis: Secondary | ICD-10-CM | POA: Diagnosis present

## 2021-10-31 DIAGNOSIS — Z993 Dependence on wheelchair: Secondary | ICD-10-CM | POA: Diagnosis present

## 2021-10-31 DIAGNOSIS — M7918 Myalgia, other site: Secondary | ICD-10-CM | POA: Insufficient documentation

## 2021-10-31 DIAGNOSIS — G8220 Paraplegia, unspecified: Secondary | ICD-10-CM | POA: Diagnosis present

## 2021-10-31 NOTE — Patient Instructions (Addendum)
Plan: Will increase R SCM Botox 15- to  30 units and L levator was 20 units and can increase to 40 units.   2. Patient here for trigger point injections for  Consent done and on chart.  Cleaned areas with alcohol and injected using a 27 gauge 1.5 inch needle  Injected 6cc Using 1% Lidocaine with no EPI  Upper traps- B/L  Levators B/L  Posterior scalenes Middle scalenes- B/L  Splenius Capitus- B/L  Pectoralis Major- B/L  Rhomboids- B/L x3 Infraspinatus Teres Major/minor- dry needled B/L  Thoracic paraspinals Lumbar paraspinals Other injections-    Patient's level of pain prior was 8/10 Current level of pain after injections is- 4-5/10  There was no bleeding or complications.  Patient was advised to drink a lot of water on day after injections to flush system Will have increased soreness for 12-48 hours after injections.  Can use Lidocaine patches the day AFTER injections Can use theracane on day of injections in places didn't inject Can use heating pad 4-6 hours AFTER injections  3. Plan to get Ulnar transposition on L due to ulnar neuropathy/compressive- from w/c-   4. F/U- 6 weeks- and add appointments q 6 weeks for every other appt Botox and trigger point injections every appt-   5. D/w surgeon ulnar transposition- on L- and how long would be NWB.

## 2021-10-31 NOTE — Progress Notes (Addendum)
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  Also has unusual sensations/spasms-. Here for f/u on SCI. With newly dx'd Anterocollis greater than torticollis-  L 4th digit DIP tendon rupture B/L carpal tunnel syndrome L ulnar compressive neuropathy.    Last appointment- did Botox for cervical dystonia.  Injected 15 units into R SCM- upper third of muscle- used 30 gauge 1/2 inch needle   2. Injected 20 units into L Levator- used 30 gauge 1/2 inch needle.  From botox last visit- Didn't notice any improvement at all- No additional pain; no additional weakness- nothing out of norm.   Chin still pulling to left. Head still pulling forward- and wrenching him forward- head still pulled to left- and when wakes up in AM, head turned to 35-45 degrees to left- rotated.    Having more problems with transfers due to LUE weakness.     When spreads shoulders/stretches- pain runs around   Hurting underneath butt-    Ruptured a tendon in L ringer finger at distal DIP- has to wear a splint on L 4th digit for 1 month. Ortho at [facility] taking care of it.    Exam: Awake, alert, appropriate, sitting in manual w/c; accompanied by wife, [name], NAD Head rotated very slightly to left; But chin to L and top of head to R.  And L shoulder also still elevated- not really an improvement.  More R lateral collis noted from behind- L scapula sitting a little higher than R.  L SCM less tight than last time after Botox.   Tight levators, scalenes, less so upper traps- also very tight in rhomboids, esp medial to scapulae B/L  Losing intrinsic muscles/atrophy in L hand- due ot ulnar neuropathy   Plan: Will increase R SCM Botox 15- to  30 units and L levator was 20 units and can increase to 40 units.   2. Patient here for trigger point  injections for  Consent done and on chart.  Cleaned areas with alcohol and injected using a 27 gauge 1.5 inch needle  Injected 6cc Using 1% Lidocaine with no EPI  Upper traps- B/L  Levators B/L  Posterior scalenes Middle scalenes- B/L  Splenius Capitus- B/L  Pectoralis Major- B/L  Rhomboids- B/L x3 Infraspinatus Teres Major/minor- dry needled B/L  Thoracic paraspinals Lumbar paraspinals Other injections-    Patient's level of pain prior was 8/10 Current level of pain after injections is- 4-5/10  There was no bleeding or complications.  Patient was advised to drink a lot of water on day after injections to flush system Will have increased soreness for 12-48 hours after injections.  Can use Lidocaine patches the day AFTER injections Can use theracane on day of injections in places didn't inject Can use heating pad 4-6 hours AFTER injections  3. Plan to get Ulnar transposition on L due to ulnar neuropathy/compressive- from w/c-   4. F/U- 6 weeks- and add appointments q 6 weeks for every other appt Botox and trigger point injections every appt-   5. D/w surgeon ulnar transposition- on L- and how long would be NWB.  6. Will discuss new w/c at next visit- needs bigger back; brakes are shot; needs new cushion Might be better to go with 16 inch w/c? Uses [facility]. Call to schedule w/c eval late march- and then I will give Rx's  at next appt- wants Quickie- not tylite.  Will need power assist for new w/c due to all his UB weakness and torn RTC on R and ulnar and median neuropathies.   I spent a total of 40 minutes on  appt- 10 minutes doing trigger point injections- and 30 minutes on f/u- going over options and figuring out with Botox rep what muscles to inject next and dosage.  Also discussing ulnar transposition-

## 2021-12-21 ENCOUNTER — Encounter: Payer: Self-pay | Admitting: Physical Medicine and Rehabilitation

## 2021-12-21 ENCOUNTER — Encounter: Payer: Medicare HMO | Attending: Physical Medicine and Rehabilitation | Admitting: Physical Medicine and Rehabilitation

## 2021-12-21 ENCOUNTER — Other Ambulatory Visit: Payer: Self-pay

## 2021-12-21 VITALS — BP 130/75 | HR 90 | Temp 97.8°F | Ht 72.0 in

## 2021-12-21 DIAGNOSIS — Z993 Dependence on wheelchair: Secondary | ICD-10-CM | POA: Diagnosis present

## 2021-12-21 DIAGNOSIS — G8220 Paraplegia, unspecified: Secondary | ICD-10-CM | POA: Diagnosis present

## 2021-12-21 DIAGNOSIS — R252 Cramp and spasm: Secondary | ICD-10-CM | POA: Diagnosis present

## 2021-12-21 DIAGNOSIS — M436 Torticollis: Secondary | ICD-10-CM | POA: Insufficient documentation

## 2021-12-21 DIAGNOSIS — M7918 Myalgia, other site: Secondary | ICD-10-CM | POA: Insufficient documentation

## 2021-12-21 NOTE — Progress Notes (Signed)
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  ?Also has unusual sensations/spasms-. ?Here for f/u on SCI. ?With newly dx'd Anterocollis greater than torticollis-  ?L 4th digit DIP tendon rupture ?B/L carpal tunnel syndrome ?L ulnar compressive neuropathy.  ? ?Last got Botox of L hip flexor- iliopsoas and rectus femoris ? ?So having pain in groin B/L and low back and pops- also in abdominal muscles-  ?Will pop and roll down shoulders.  ?BOTH legs stands up when lay down flat- has to push legs down- eventually will go flat, but takes 10+ minutes ? ? ?Went to ED yesterday per my request since fell and hit head- dx'd with concussion.  ? ? ? ? ?Exam: ?Sitting up in w/c- manual; accompanied by GF;  NAD ?Knot on occiput from fall yesterday-  ?C/o feeling woozy- doing better ?Head tilted to Right but chin to left-  at rest- chin also pulling forward.  ? ?Plan: ?Will see if Dr Wynn Banker can redo Botox of LE's/ileopsoas ?But do B/L - would like bilateral not just L side if possible.  ? ?2. Patient here for trigger point injections for ? Consent done and on chart. ? ?Cleaned areas with alcohol and injected using a 27 gauge 1.5 inch needle ? ?Injected 6cc ?Using 1% Lidocaine with no EPI ? ?Upper traps B/L  ?Levators R only ?Posterior scalenes ?Middle scalenes- B/L  ?Splenius Capitus- B/L x2 ?Pectoralis Major- B/L  ?Rhomboids- B/L  ?Infraspinatus ?Teres Major/minor ?Thoracic paraspinals B/L  ?Lumbar paraspinals- B/L x2 ?Other injections-  ? ? ?Patient's level of pain prior was 9/10 ?Current level of pain after injections is 6/10 ? ?There was no bleeding or complications. ? ?Patient was advised to drink a lot of water on day after injections to flush system ?Will have increased soreness for 12-48 hours after injections.  ?Can use Lidocaine patches the day AFTER  injections ?Can use theracane on day of injections in places didn't inject ?Can use heating pad 4-6 hours AFTER injections ? ?3.  ?Botulinum toxin injection for cervical dystonia ?CPT code 17915 ?Diagnosis code G 24.3 ?Indication is cervical dystonia that has not responded to conservative care and interferes with activities of daily living as well as cervical range of motion. ?Chronic cervical pain not relieved by other treatments. ? ?Informed consent was obtained after describing risks and benefits of the procedure with the patient this included bleeding bruising and infection The patient elects to proceed and has given Written consent.  REMS form completed ? ?Patient placed in a seated position ?A 30 gauge needle was used to inject muscle based on visual and muscle activation guidance.  ? ?Muscles and dosing: ?30 units R SCM and 40 units L levator ? ?Flashed blood as took needle OUT of skin on L levator-  ?Wasted 30 units Botox ? ?All injections done after negative drawback for blood. Patient tolerated procedure well. Post procedure instructions given. Follow up appointment made  ? ?4. Pt is a 41 yr old male with T5 paraplegia- complete- ASIA A- who absolutely needs a new manual w/c- ultralight - with power assist wheels- due to Rotator cuff tear in R shoulder- - with foot plates and ROHO 4 chamber ROHO due to: sits unevenly- hx of 2 flap surgeries, so absolutely needs the 4 chamber ROHO due to sitting unevenly/hips uneven- ?Needs Quickie if as  all possible- due to it sitting closer and wheels don't stick out .  ?Pt has spasticity due to his paraplegia and needs w/c since unable to walk at all- due for new w/c- has been 5 years- current Cushion has blown chamber and needs new cushion ASAP as well- - tires bald; brakes not setting anymore- casters stick and back needs replacement- literally back keeps falling off- caused a fall and ER visit.  ?Absolutely needs it replaced- due to wheelchair breaking down.  ? ?5.  Will give Rx for w/c evaluation by OT or PT- and attach justification. Called jason and gave him pt info for pt w/c.  ? ?6. F/U- 3 months-double appt-  ?And 6 weeks for trigger point injections.  ? ? ?I spent a total of  46  minutes on total care today- >50% coordination of care- due to  6 minutes doing Botox; 10 minutes on TrP injection and f/u on W/C evaluation.  ? ? ?

## 2021-12-21 NOTE — Patient Instructions (Signed)
Plan: ?Will see if Dr Wynn Banker can redo Botox of LE's/ileopsoas ?But do B/L - would like bilateral not just L side if possible.  ? ?2. Patient here for trigger point injections for ? Consent done and on chart. ? ?Cleaned areas with alcohol and injected using a 27 gauge 1.5 inch needle ? ?Injected 6cc ?Using 1% Lidocaine with no EPI ? ?Upper traps B/L  ?Levators R only ?Posterior scalenes ?Middle scalenes- B/L  ?Splenius Capitus- B/L x2 ?Pectoralis Major- B/L  ?Rhomboids- B/L  ?Infraspinatus ?Teres Major/minor ?Thoracic paraspinals B/L  ?Lumbar paraspinals- B/L x2 ?Other injections-  ? ? ?Patient's level of pain prior was 9/10 ?Current level of pain after injections is 6/10 ? ?There was no bleeding or complications. ? ?Patient was advised to drink a lot of water on day after injections to flush system ?Will have increased soreness for 12-48 hours after injections.  ?Can use Lidocaine patches the day AFTER injections ?Can use theracane on day of injections in places didn't inject ?Can use heating pad 4-6 hours AFTER injections ? ?3.  ?Botulinum toxin injection for cervical dystonia ?CPT code 16109 ?Diagnosis code G 24.3 ?Indication is cervical dystonia that has not responded to conservative care and interferes with activities of daily living as well as cervical range of motion. ?Chronic cervical pain not relieved by other treatments. ? ?Informed consent was obtained after describing risks and benefits of the procedure with the patient this included bleeding bruising and infection The patient elects to proceed and has given Written consent.  REMS form completed ? ?Patient placed in a seated position ?A 30 gauge needle was used to inject muscle based on visual and muscle activation guidance.  ? ?Muscles and dosing: ?30 units R SCM and 40 units L levator ? ?Flashed blood as took needle OUT of skin on L levator-  ?Wasted 30 units Botox ? ?All injections done after negative drawback for blood. Patient tolerated procedure  well. Post procedure instructions given. Follow up appointment made  ? ?4. Pt is a 41 yr old male with T5 paraplegia- complete- ASIA A- who absolutely needs a new manual w/c- ultralight - with power assist wheels- due to Rotator cuff tear in R shoulder- - with foot plates and ROHO 4 chamber ROHO due to: sits unevenly- hx of 2 flap surgeries, so absolutely needs the 4 chamber ROHO due to sitting unevenly/hips uneven- ?Needs Quickie if as all possible- due to it sitting closer and wheels don't stick out .  ?Pt has spasticity due to his paraplegia and needs w/c since unable to walk at all- due for new w/c- has been 5 years- current Cushion has blown chamber and needs new cushion ASAP as well- - tires bald; brakes not setting anymore- casters stick and back needs replacement- literally back keeps falling off- caused a fall and ER visit.  ?Absolutely needs it replaced- due to wheelchair breaking down.  ? ?5. Will give Rx for w/c evaluation by OT or PT- and attach justification. Called jason and gave him pt info for pt w/c.  ? ?6. F/U- 3 months-double appt-  ?And 6 weeks for trigger point injections.  ?

## 2022-01-09 ENCOUNTER — Encounter: Payer: Self-pay | Admitting: Physical Medicine and Rehabilitation

## 2022-01-09 ENCOUNTER — Encounter
Payer: Medicare Other | Attending: Physical Medicine and Rehabilitation | Admitting: Physical Medicine and Rehabilitation

## 2022-01-09 VITALS — BP 120/76 | HR 93 | Temp 98.3°F | Ht 72.0 in | Wt 165.0 lb

## 2022-01-09 DIAGNOSIS — M436 Torticollis: Secondary | ICD-10-CM | POA: Diagnosis present

## 2022-01-09 DIAGNOSIS — M7918 Myalgia, other site: Secondary | ICD-10-CM | POA: Diagnosis present

## 2022-01-09 DIAGNOSIS — G8220 Paraplegia, unspecified: Secondary | ICD-10-CM | POA: Diagnosis present

## 2022-01-09 MED ORDER — DIAZEPAM 5 MG PO TABS: 5.0000 mg | ORAL_TABLET | Freq: Three times a day (TID) | ORAL | 5 refills | Status: DC

## 2022-01-09 NOTE — Patient Instructions (Signed)
Plan: ?Patient here for trigger point injections for ? Consent done and on chart. ? ?Cleaned areas with alcohol and injected using a 27 gauge 1.5 inch needle ? ?Injected 9cc ?Using 1% Lidocaine with no EPI ? ?Upper traps- B/L  ?Levators- B/L  ?Posterior scalenes ?Middle scalenes- B/L  ?Splenius Capitus- B/L  ?Pectoralis Major- B/L  ?Rhomboids- B/L x3 ?Infraspinatus ?Teres Major/minor ?Thoracic paraspinals- R only x3 ?Lumbar paraspinals- B/L  ?Other injections-  ? ? ?Patient's level of pain prior was 8/10 ?Current level of pain after injections is 4-5/10 now ? ?There was no bleeding or complications. ? ?Patient was advised to drink a lot of water on day after injections to flush system ?Will have increased soreness for 12-48 hours after injections.  ?Can use Lidocaine patches the day AFTER injections ?Can use theracane on day of injections in places didn't inject ?Can use heating pad 4-6 hours AFTER injections ? ?2.  Refill Valium 5 mg TID prn for spasms- #90- 5 refills ? ? ?3. Gets Baclofen and gabapentin from PCP ? ? ?4. Gets pain meds from Dr Roderic Ovens ? ?5. F/U in 6 weeks for Botox of neck for cervical dystoni.  ? ?6. Will increase Botox dosing by 10 units in each location at next visit.  ? ?7. Has botox of Iliopsoas by Dr Wynn Banker May 3rd.  ?

## 2022-01-09 NOTE — Progress Notes (Signed)
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  ?Also has unusual sensations/spasms-. ?Here for f/u on SCI. ?With newly dx'd Anterocollis greater than torticollis-  ?L 4th digit DIP tendon rupture ?B/L carpal tunnel syndrome ?L ulnar compressive neuropathy.  ?  ?Saw [facility] yesterday- to see Jae Dire at [facility] for W/C eval.  ?Discussing options for w/c- will see what to do.  ? ? ?Got Botox of bladder last Friday- 5 days ago- kicked in at 3 days-  ?Now holding at least 600cc- actually held 800cc last night, but explained that's too much.  ? ? ?Has more ROM of neck and can correct for dystonia/head tilt much better and neck pain is better since got Botox  ? ?Still having pain around abdomen- and wraps around to middle of back- maybe from doing weights or pulled/tore a muscle.  ? ?Can't do crunches anymore. Due ot rods ? ?Spasticity doing OK- esp since doing Botox.  ? ?Exam: ?Less head tilt- chin a little to left and head to R- but ~ 50% better ? ?Plan: ?Patient here for trigger point injections for ? Consent done and on chart. ? ?Cleaned areas with alcohol and injected using a 27 gauge 1.5 inch needle ? ?Injected 9cc ?Using 1% Lidocaine with no EPI ? ?Upper traps- B/L  ?Levators- B/L  ?Posterior scalenes ?Middle scalenes- B/L  ?Splenius Capitus- B/L  ?Pectoralis Major- B/L  ?Rhomboids- B/L x3 ?Infraspinatus ?Teres Major/minor ?Thoracic paraspinals- R only x3 ?Lumbar paraspinals- B/L  ?Other injections-  ? ? ?Patient's level of pain prior was 8/10 ?Current level of pain after injections is 4-5/10 now ? ?There was no bleeding or complications. ? ?Patient was advised to drink a lot of water on day after injections to flush system ?Will have increased soreness for 12-48 hours after injections.  ?Can use Lidocaine patches the day AFTER injections ?Can use theracane  on day of injections in places didn't inject ?Can use heating pad 4-6 hours AFTER injections ? ?2.  Refill Valium 5 mg TID prn for spasms- #90- 5 refills ? ? ?3. Gets Baclofen and gabapentin from PCP ? ? ?4. Gets pain meds from Dr Roderic Ovens ? ?5. F/U in 6 weeks for Botox of neck for cervical dystoni.  ? ?6. Will increase Botox dosing by 10 units in each location at next visit.  ? ?7. Has botox of Iliopsoas by Dr Wynn Banker May 3rd.  ? ? ?I spent a total of 36   minutes on total care today- >50% coordination of care- due to 10 minutes on injections and 26 minutes on f/u.  ? ?

## 2022-01-14 ENCOUNTER — Encounter: Payer: Medicare Other | Admitting: Physical Medicine and Rehabilitation

## 2022-02-07 ENCOUNTER — Encounter: Payer: Medicare Other | Admitting: Physical Medicine & Rehabilitation

## 2022-02-25 ENCOUNTER — Encounter: Payer: Self-pay | Admitting: Physical Medicine and Rehabilitation

## 2022-02-25 ENCOUNTER — Encounter
Payer: Medicare Other | Attending: Physical Medicine and Rehabilitation | Admitting: Physical Medicine and Rehabilitation

## 2022-02-25 VITALS — BP 137/77 | HR 80 | Ht 72.0 in

## 2022-02-25 DIAGNOSIS — M7918 Myalgia, other site: Secondary | ICD-10-CM | POA: Insufficient documentation

## 2022-02-25 DIAGNOSIS — G8220 Paraplegia, unspecified: Secondary | ICD-10-CM | POA: Insufficient documentation

## 2022-02-25 DIAGNOSIS — M436 Torticollis: Secondary | ICD-10-CM | POA: Diagnosis present

## 2022-02-25 DIAGNOSIS — Z993 Dependence on wheelchair: Secondary | ICD-10-CM | POA: Insufficient documentation

## 2022-02-25 NOTE — Patient Instructions (Signed)
Plan: Patient here for trigger point injections for  Consent done and on chart.  Cleaned areas with alcohol and injected using a 27 gauge 1.5 inch needle  Injected 9cc Using 1% Lidocaine with no EPI  Upper traps B/L  Levators- B/L  Posterior scalenes Middle scalenes- B/L  Splenius Capitus- B/L  Pectoralis Major- B/L  Rhomboids- B/L x2 Infraspinatus Teres Major/minor- B/L x2 Thoracic paraspinals- R only x2 Lumbar paraspinals- B/L  Other injections- R serratus anterior x2   Patient's level of pain prior was 9/10 Current level of pain after injections is 4-5/10- can stretch now-   There was no bleeding or complications.  Patient was advised to drink a lot of water on day after injections to flush system Will have increased soreness for 12-48 hours after injections.  Can use Lidocaine patches the day AFTER injections Can use theracane on day of injections in places didn't inject Can use heating pad 4-6 hours AFTER injections   2. Botox per Dr Wynn Banker  3. Insurance will not let him get w/c until July- needs a PPO- and can not get until July to be changed.    4. F/U q6 weeks-

## 2022-02-25 NOTE — Progress Notes (Signed)
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  Also has unusual sensations/spasms-. Here for f/u on SCI. With newly dx'd Anterocollis greater than torticollis-  L 4th digit DIP tendon rupture B/L carpal tunnel syndrome L ulnar compressive neuropathy.  Going to get Botox by Dr Letta Pate for Iliopsoas as well as torticollis.   Muscle in R shoulder/R side- rolls down and cannot sit up straight anymore   Hurts from R shoulder to R buttock- along lateral side. Constantly popping it per wife.  Constantly rolling R shoulder - "winding it up". And stays tight across R pec and "curving him frontwards".     Exam: Tight trigger points all over back and neck-  as per plan- documented.  Torticollis noted with chin to L and head to Right- L shoulder higher than R shoulder Scoliosis noted- S curve lumbar spine   Plan: Patient here for trigger point injections for  Consent done and on chart.  Cleaned areas with alcohol and injected using a 27 gauge 1.5 inch needle  Injected 9cc Using 1% Lidocaine with no EPI  Upper traps B/L  Levators- B/L  Posterior scalenes Middle scalenes- B/L  Splenius Capitus- B/L  Pectoralis Major- B/L  Rhomboids- B/L x2 Infraspinatus Teres Major/minor- B/L x2 Thoracic paraspinals- R only x2 Lumbar paraspinals- B/L  Other injections- R serratus anterior x2   Patient's level of pain prior was 9/10 Current level of pain after injections is 4-5/10- can stretch now-   There was no bleeding or complications.  Patient was advised to drink a lot of water on day after injections to flush system Will have increased soreness for 12-48 hours after injections.  Can use Lidocaine patches the day AFTER injections Can use theracane on day of injections in places didn't inject Can use heating pad 4-6 hours  AFTER injections   2. Botox per Dr Letta Pate  3. Insurance will not let him get w/c until July- needs a PPO- and can not get until July to be changed.    4. F/U q6 weeks-

## 2022-04-05 ENCOUNTER — Encounter: Payer: Self-pay | Admitting: Physical Medicine & Rehabilitation

## 2022-04-05 ENCOUNTER — Ambulatory Visit: Payer: Medicare HMO | Admitting: Physical Medicine and Rehabilitation

## 2022-04-05 ENCOUNTER — Encounter: Payer: Medicare Other | Attending: Physical Medicine and Rehabilitation | Admitting: Physical Medicine & Rehabilitation

## 2022-04-05 VITALS — BP 133/83 | HR 84

## 2022-04-05 DIAGNOSIS — G839 Paralytic syndrome, unspecified: Secondary | ICD-10-CM | POA: Insufficient documentation

## 2022-04-05 DIAGNOSIS — G8929 Other chronic pain: Secondary | ICD-10-CM | POA: Diagnosis not present

## 2022-04-05 DIAGNOSIS — G249 Dystonia, unspecified: Secondary | ICD-10-CM | POA: Diagnosis not present

## 2022-04-05 DIAGNOSIS — G243 Spasmodic torticollis: Secondary | ICD-10-CM

## 2022-04-05 NOTE — Progress Notes (Signed)
Botox Injection for spasticity using needle EMG guidance  Dilution: 50 Units/ml Indication: Severe spasticity which interferes with ADL,mobility and/or  hygiene and is unresponsive to medication management and other conservative care Informed consent was obtained after describing risks and benefits of the procedure with the patient. This includes bleeding, bruising, infection, excessive weakness, or medication side effects. A REMS form is on file and signed. Needle: 25g 2" needle electrode Number of units per muscle Right iliopsoas 100U Left Rectus fem 50 Left Vastus int 50 Left vastus med 50 Left vastus lat 50 All injections were done after obtaining appropriate EMG activity and after negative drawback for blood. The patient tolerated the procedure well. Post procedure instructions were given. A followup appointment was made.

## 2022-04-05 NOTE — Patient Instructions (Signed)

## 2022-04-05 NOTE — Progress Notes (Signed)
Botulinum toxin injection for cervical dystonia CPT code 29562 Diagnosis code G 24.3 Indication is cervical dystonia that has not responded to conservative care and interferes with activities of daily living as well as cervical range of motion. Chronic cervical pain not relieved by other treatments.  Informed consent was obtained after describing risks and benefits of the procedure with the patient this included bleeding bruising and infection The patient elects to proceed and has given Written consent.  REMS form completed  Patient placed in a seated position A 27-gauge 1 inch needle electrode was used to guide the injection under EMG guidance.  Muscles and dosing: Left SCM 25 Right SCM 25 Right Levator 25  Right Splenius cap 25  All injections done after negative drawback for blood. Patient tolerated procedure well. Post procedure instructions given. Follow up appointment made

## 2022-04-15 ENCOUNTER — Ambulatory Visit: Payer: Medicare HMO | Admitting: Physical Medicine and Rehabilitation

## 2022-05-17 ENCOUNTER — Encounter: Payer: Self-pay | Admitting: Physical Medicine and Rehabilitation

## 2022-05-17 ENCOUNTER — Encounter: Payer: 59 | Attending: Physical Medicine and Rehabilitation | Admitting: Physical Medicine and Rehabilitation

## 2022-05-17 VITALS — BP 113/69 | HR 89 | Ht 72.0 in | Wt 163.0 lb

## 2022-05-17 DIAGNOSIS — M7918 Myalgia, other site: Secondary | ICD-10-CM

## 2022-05-17 DIAGNOSIS — G8220 Paraplegia, unspecified: Secondary | ICD-10-CM | POA: Diagnosis present

## 2022-05-17 DIAGNOSIS — R252 Cramp and spasm: Secondary | ICD-10-CM | POA: Diagnosis present

## 2022-05-17 DIAGNOSIS — Z993 Dependence on wheelchair: Secondary | ICD-10-CM

## 2022-05-17 MED ORDER — DIAZEPAM 5 MG PO TABS: 5.0000 mg | ORAL_TABLET | Freq: Three times a day (TID) | ORAL | 5 refills | Status: DC

## 2022-05-17 NOTE — Patient Instructions (Signed)
Plan: Have asked Dr Antoine Primas to see if osteopathic manipulation would be helpful- for pt's rolling and popping that won't stay relieved.   2. Patient here for trigger point injections for  Consent done and on chart.  Cleaned areas with alcohol and injected using a 27 gauge 1.5 inch needle  Injected 9cc Using 1% Lidocaine with no EPI  Upper traps B/L x2 Levators- B/L  Posterior scalenes Middle scalenes- B/L x2 Splenius Capitus B/L  Pectoralis Major- B/L  Rhomboids B/L x2 Infraspinatus Teres Major/minor B/L  Thoracic paraspinals B/L Lumbar paraspinals Other injections- deltoids B/L and triceps B/L ; and cervical paraspinals B/L  Got LOTS of twitch responses esp in upper trap; deltoids and teres   Patient's level of pain prior was 8/10 Current level of pain after injections is 4-5/10  There was no bleeding or complications.  Patient was advised to drink a lot of water on day after injections to flush system Will have increased soreness for 12-48 hours after injections.  Can use Lidocaine patches the day AFTER injections Can use theracane on day of injections in places didn't inject Can use heating pad 4-6 hours AFTER injections   3. Needs refills of Valium-  Takes 5 mg TID #90- 5 refills sent in  4. Con't Baclofen and Zanaflex at current does  5. Con't Gabapentin and Percocet per Pain doctor.    6. F/U as scheduled- q6 weeks- alternating Botox and Trp injections and just Trp injections.

## 2022-05-17 NOTE — Progress Notes (Addendum)
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  Also has unusual sensations/spasms-. Here for f/u on SCI. With newly dx'd Anterocollis greater than torticollis-  L 4th digit DIP tendon rupture B/L carpal tunnel syndrome L ulnar compressive neuropathy. Got approved for power assist w/c- will get in next month  Pt got Botox 04/05/22 by Dr Wynn Banker Right iliopsoas 100U Left Rectus fem 50 Left Vastus int 50 Left vastus med 50 Left vastus lat 50 Muscles and dosing: Left SCM 25 Right SCM 25 Right Levator 25  Right Splenius cap 25  Still having  "popping and rolling" of muscles in sides that radiate up to head and always "popping"- and will release, but won't stay released- relief lasts 1-2 minutes, then tightens back up Trigger point injections help, but don't last long enough.   Pain Clinic is trying to do CT-  chest and sides and hips- ut they haven't called to schedule yet.   Legs haven't been jumping- last time injections of legs lasted 1 year.  So wants Botox by me for neck this next time.    Exam: Head tilt t R- chin to L-  In manual w/c, accompanied wife, NAD Trigger points pretty much everywhere- in mid/upper back and neck and shoulders and upper arms  Plan: Have asked Dr Antoine Primas to see if osteopathic manipulation would be helpful- for pt's rolling and popping that won't stay relieved.   2. Patient here for trigger point injections for  Consent done and on chart.  Cleaned areas with alcohol and injected using a 27 gauge 1.5 inch needle  Injected 9cc Using 1% Lidocaine with no EPI  Upper traps B/L x2 Levators- B/L  Posterior scalenes Middle scalenes- B/L x2 Splenius Capitus B/L  Pectoralis Major- B/L  Rhomboids B/L x2 Infraspinatus Teres Major/minor B/L  Thoracic paraspinals B/L Lumbar  paraspinals Other injections- deltoids B/L and triceps B/L ; and cervical paraspinals B/L  Got LOTS of twitch responses esp in upper trap; deltoids and teres   Patient's level of pain prior was 8/10 Current level of pain after injections is 4-5/10  There was no bleeding or complications.  Patient was advised to drink a lot of water on day after injections to flush system Will have increased soreness for 12-48 hours after injections.  Can use Lidocaine patches the day AFTER injections Can use theracane on day of injections in places didn't inject Can use heating pad 4-6 hours AFTER injections   3. Needs refills of Valium-  Takes 5 mg TID #90- 5 refills sent in  4. Con't Baclofen and Zanaflex at current does  5. Con't Gabapentin and Percocet per Pain doctor.    6. F/U as scheduled- q6 weeks- alternating Botox and Trp injections and just Trp injections.   I spent a total of 32   minutes on total care today- >50% coordination of care- due to 10 minutes for trigger point injections- and 20 minutes for discussing what to do next/plan and d/w Dr Katrinka Blazing about manipulation.

## 2022-07-05 ENCOUNTER — Encounter: Payer: 59 | Admitting: Physical Medicine and Rehabilitation

## 2022-07-10 ENCOUNTER — Encounter: Payer: Self-pay | Admitting: Physical Medicine and Rehabilitation

## 2022-07-10 ENCOUNTER — Encounter: Payer: 59 | Attending: Physical Medicine and Rehabilitation | Admitting: Physical Medicine and Rehabilitation

## 2022-07-10 VITALS — BP 137/89 | HR 98

## 2022-07-10 DIAGNOSIS — M7918 Myalgia, other site: Secondary | ICD-10-CM | POA: Diagnosis not present

## 2022-07-10 DIAGNOSIS — G8220 Paraplegia, unspecified: Secondary | ICD-10-CM

## 2022-07-10 DIAGNOSIS — R252 Cramp and spasm: Secondary | ICD-10-CM

## 2022-07-10 DIAGNOSIS — M436 Torticollis: Secondary | ICD-10-CM

## 2022-07-10 MED ORDER — ONABOTULINUMTOXINA 100 UNITS IJ SOLR
100.0000 [IU] | Freq: Once | INTRAMUSCULAR | Status: AC
  Administered 2022-07-10: 100 [IU] via INTRAMUSCULAR

## 2022-07-10 MED ORDER — SODIUM CHLORIDE (PF) 0.9 % IJ SOLN
1.0000 mL | Freq: Once | INTRAMUSCULAR | Status: AC
  Administered 2022-07-10: 1 mL via INTRAVENOUS

## 2022-07-10 NOTE — Patient Instructions (Signed)
Plan:  Botulinum toxin injection for cervical dystonia CPT code [phone] Diagnosis code G 24.3 Indication is cervical dystonia that has not responded to conservative care and interferes with activities of daily living as well as cervical range of motion. Chronic cervical pain not relieved by other treatments.  Informed consent was obtained after describing risks and benefits of the procedure with the patient this included bleeding bruising and infection The patient elects to proceed and has given Written consent.  REMS form completed   Muscles and dosing: Botox 30 units R SCM and 40 units L levator  Wasted 30 units of Botox   All injections done after negative drawback for blood. Patient tolerated procedure well. Post procedure instructions given. Follow up appointment made   2. Patient here for trigger point injections for  Consent done and on chart.  Cleaned areas with alcohol and injected using a 27 gauge 1.5 inch needle  Injected 66- no wasting of meds Using 1% Lidocaine with no EPI  Upper traps BL  Levators b/L  Posterior scalenes Middle scalenes- B/L  Splenius Capitus- B/L  Pectoralis Major- B/L  Rhomboids- B/L x2 Infraspinatus Teres Major/minor R only Thoracic paraspinals B/L x2 Lumbar paraspinals B/L  Other injections- B/L masseter    Patient's level of pain prior was 9/10 Current level of pain after injections is 5-6/10  There was no bleeding or complications.  Patient was advised to drink a lot of water on day after injections to flush system Will have increased soreness for 12-48 hours after injections.  Can use Lidocaine patches the day AFTER injections Can use theracane on day of injections in places didn't inject Can use heating pad 4-6 hours AFTER injections   3. F/U in 6 weeks for Trigger point injections.

## 2022-07-10 NOTE — Progress Notes (Signed)
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  Also has unusual sensations/spasms-. Here for f/u on SCI. With newly dx'd Anterocollis greater than torticollis-  L 4th digit DIP tendon rupture B/L carpal tunnel syndrome L ulnar compressive neuropathy. Got approved for power assist w/c- will get in next month  Got new power assist w/c- 2 screws came off already.     Cannot even lift milk jug anymore. Loses balance.   Muscles jump/"flip flops" and pops so much  The more he chews gum, the better he feels- feels like some of muscle "Popping" is in jaw.  Pain running 9/10.   Doesn't need LE Botox right now, so I can do Botox for him for cervical dystonia.   Goes to IR Tuesday to get fluid out of buttock abscess??? 10/18 and 10/26 sees urology  for renal cyst on L kidney and GI doc- mass in rectum?    Plan:  Botulinum toxin injection for cervical dystonia CPT code [phone] Diagnosis code G 24.3 Indication is cervical dystonia that has not responded to conservative care and interferes with activities of daily living as well as cervical range of motion. Chronic cervical pain not relieved by other treatments.  Informed consent was obtained after describing risks and benefits of the procedure with the patient this included bleeding bruising and infection The patient elects to proceed and has given Written consent.  REMS form completed   Muscles and dosing: Botox 30 units R SCM and 40 units L levator  Wasted 30 units of Botox   All injections done after negative drawback for blood. Patient tolerated procedure well. Post procedure instructions given. Follow up appointment made   2. Patient here for trigger point injections for  Consent done and on chart.  Cleaned areas with alcohol and injected using a 27 gauge 1.5 inch  needle  Injected 66- no wasting of meds Using 1% Lidocaine with no EPI  Upper traps BL  Levators b/L  Posterior scalenes Middle scalenes- B/L  Splenius Capitus- B/L  Pectoralis Major- B/L  Rhomboids- B/L x2 Infraspinatus Teres Major/minor R only Thoracic paraspinals B/L x2 Lumbar paraspinals B/L  Other injections- B/L masseter    Patient's level of pain prior was 9/10 Current level of pain after injections is 5-6/10  There was no bleeding or complications.  Patient was advised to drink a lot of water on day after injections to flush system Will have increased soreness for 12-48 hours after injections.  Can use Lidocaine patches the day AFTER injections Can use theracane on day of injections in places didn't inject Can use heating pad 4-6 hours AFTER injections   3. F/U in 6 weeks for Trigger point injections.    I spent a total of  25  minutes on total care today- >50% coordination of care- due to both sets of injections

## 2022-07-10 NOTE — Addendum Note (Signed)
Addended by: Jasmine December T on: 07/10/2022 01:58 PM   Modules accepted: Orders

## 2022-08-16 ENCOUNTER — Encounter: Payer: 59 | Attending: Physical Medicine and Rehabilitation | Admitting: Physical Medicine and Rehabilitation

## 2022-08-16 ENCOUNTER — Encounter: Payer: Self-pay | Admitting: Physical Medicine and Rehabilitation

## 2022-08-16 VITALS — BP 122/68 | HR 96

## 2022-08-16 DIAGNOSIS — G8220 Paraplegia, unspecified: Secondary | ICD-10-CM | POA: Diagnosis not present

## 2022-08-16 DIAGNOSIS — M7918 Myalgia, other site: Secondary | ICD-10-CM | POA: Insufficient documentation

## 2022-08-16 MED ORDER — LIDOCAINE HCL 1 % IJ SOLN
6.0000 mL | Freq: Once | INTRAMUSCULAR | Status: AC
  Administered 2022-08-16: 6 mL

## 2022-08-16 NOTE — Patient Instructions (Signed)
Plan: Patient here for trigger point injections for chronic pain-   Consent done and on chart.  Cleaned areas with alcohol and injected using a 27 gauge 1.5 inch needle  Injected 6cc- no wastage Using 1% Lidocaine with no EPI  Upper traps B/L  Levators B/L  Posterior scalenes B/L  Middle scalenes B/L x2 Splenius Capitus B/L  Pectoralis Major- B/L  Rhomboids B/ x2 Infraspinatus Teres Major/minor Thoracic paraspinals B/L x2 Lumbar paraspinals B/L  Other injections- B/L first dorsal interossei and thenar eminence B/L SCM-   Patient's level of pain prior was  9/10 Current level of pain after injections is- 6/10- maybe 5/10- more tolerable.   There was no bleeding or complications.  Patient was advised to drink a lot of water on day after injections to flush system Will have increased soreness for 12-48 hours after injections.  Can use Lidocaine patches the day AFTER injections Can use theracane on day of injections in places didn't inject Can use heating pad 4-6 hours AFTER injections   2. Referral to Dr [name]- DO- [facility] for [facility]opathic manipulation- SOFT, not hard manipulation- Don't see Dr Antoine Primas, only Dr Jean Rosenthal since due to your surgeries in neck and thoracic spine, Dr Katrinka Blazing won't touch you most likely.  Used to be in w/c 18+ hours/day- some days, <1 hour in w/c- and used to be able to lift/was very strong in UB, but cannot lift a gallon of milk.  Cannot figure out cause except severe myofascial pain? Dr Jean Rosenthal, any ideas?  3. Con't Valium -doesn't need refills  4. Call [facility]- due to UB weakness- and UB sensory deificts that are intermittent  5. 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 F/U q6 weeks- alternating single/double appts.

## 2022-08-16 NOTE — Progress Notes (Signed)
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  Also has unusual sensations/spasms-. Here for f/u on SCI. With newly dx'd Anterocollis greater than torticollis-  L 4th digit DIP tendon rupture B/L carpal tunnel syndrome L ulnar compressive neuropathy. Got approved for power assist w/c- will get in next month    Things not good Still hurts across chest Has pain from R , B/L actually groin to low back and radiates up to top of R and L shoulder and into neck.  R buttock hurts the worst. Like knife sticking into R buttock Still having muscle roll even when coughs hard- Cannot reach for anything- because it will set off this pain as detailed above.   Also when leans forward in w/c- rolls down back to  buttocks/low back as well.  Had mammogram and colonoscopy- which came back negative.  Last CT of chest, abdomen and Pelvis has nothing about gallbladder.   Also spending most of day in bed due to pain- cannot drive easily, can't sit up in w/c for prolonged period (pt used to be in w/c 18+ hours/day); even affects breathing and sleeping due to pain.   Even when strains to have BM, feels things pop and into groin. And rally painful in lower abdominal muscles.   Feels like botox has straightened a little bit, but still rotating to R somewhat.  So tight right now, cannot tell overall.   Getting MRI of B/L shoulders this month- due to UB weakness-  Also has carpal tunnel syndrome B/L as well.   Plan: Patient here for trigger point injections for chronic pain-   Consent done and on chart.  Cleaned areas with alcohol and injected using a 27 gauge 1.5 inch needle  Injected 6cc- no wastage Using 1% Lidocaine with no EPI  Upper traps B/L  Levators B/L  Posterior scalenes B/L  Middle scalenes B/L x2 Splenius Capitus B/L   Pectoralis Major- B/L  Rhomboids B/ x2 Infraspinatus Teres Major/minor Thoracic paraspinals B/L x2 Lumbar paraspinals B/L  Other injections- B/L first dorsal interossei and thenar eminence B/L SCM-   Patient's level of pain prior was  9/10 Current level of pain after injections is- 6/10- maybe 5/10- more tolerable.   There was no bleeding or complications.  Patient was advised to drink a lot of water on day after injections to flush system Will have increased soreness for 12-48 hours after injections.  Can use Lidocaine patches the day AFTER injections Can use theracane on day of injections in places didn't inject Can use heating pad 4-6 hours AFTER injections   2. Referral to Dr [name]- DO- [facility] for osteopathic manipulation- SOFT, not hard manipulation- Don't see Dr Antoine Primas, only Dr Jean Rosenthal since due to your surgeries in neck and thoracic spine, Dr Katrinka Blazing won't touch you most likely.  Used to be in w/c 18+ hours/day- some days, <1 hour in w/c- and used to be able to lift/was very strong in UB, but cannot lift a gallon of milk.  Cannot figure out cause except severe myofascial pain? Dr Jean Rosenthal, any ideas?  3. Con't Valium -doesn't need refills  4. Call [facility]- due to UB weakness- and UB sensory deificts that are intermittent  5. 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 F/U q6 weeks- alternating single/double appts.  I spent a total of    minutes on total care today- >50% coordination of care- due to 10 minute son injections and 21 minutes discussing options for pain and referral to DO.

## 2022-08-20 NOTE — Progress Notes (Unsigned)
   Devin Collins D.[facility] [address] [place] [address] Phone: [phone]   Assessment and Plan:     There are no diagnoses linked to this encounter.  *** - Patient has received significant relief with OMT in the past.  Elects for repeat OMT today.  Tolerated well per note below. - Decision today to treat with OMT was based on Physical Exam   After verbal consent patient was treated with HVLA (high velocity low amplitude), ME (muscle energy), FPR (flex positional release), ST (soft tissue), PC/PD (Pelvic Compression/ Pelvic Decompression) techniques in cervical, rib, thoracic, lumbar, and pelvic areas. Patient tolerated the procedure well with improvement in symptoms.  Patient educated on potential side effects of soreness and recommended to rest, hydrate, and use Tylenol as needed for pain control.   Pertinent previous records reviewed include ***   Follow Up: ***     Subjective:   I, Devin Collins, am serving as a Neurosurgeon for Devin Collins  Chief Complaint: OMT  HPI:   08/21/2022 Patient is a 41 year old male complaining of back pain. patient states  Relevant Historical Information: ***  Additional pertinent review of systems negative.  Current Outpatient Medications  Medication Sig Dispense Refill   ascorbic Acid (VITAMIN C) 500 MG CPCR Take 1 tablet by mouth daily.     baclofen (LIORESAL) 20 MG tablet Take 1 tablet by mouth 3 (three) times daily.     cefadroxil (DURICEF) 500 MG capsule Take by mouth.     diazepam (VALIUM) 5 MG tablet Take 1 tablet (5 mg total) by mouth 3 (three) times daily. For spasticity 90 tablet 5   gabapentin (NEURONTIN) 400 MG capsule Take 400 mg by mouth 3 (three) times daily.     levofloxacin (LEVAQUIN) 750 MG tablet Take 750 mg by mouth daily.     methenamine (HIPREX) 1 g tablet Take by mouth.     Multiple Vitamin (MULTI-VITAMIN) tablet Take by mouth.     naloxone (NARCAN) nasal spray 4 mg/0.1 mL  Place into the nose.     nystatin ointment (MYCOSTATIN) Apply topically 2 (two) times daily.     polyethylene glycol powder (GLYCOLAX/MIRALAX) 17 GM/SCOOP powder Take 17 g by mouth daily.     Probiotic Product (MISC INTESTINAL FLORA REGULAT) CAPS Take 1 capsule by mouth every morning.     tiZANidine (ZANAFLEX) 4 MG tablet Take 2 tablets by mouth 3 (three) times daily.     No current facility-administered medications for this visit.      Objective:     There were no vitals filed for this visit.    There is no height or weight on file to calculate BMI.    Physical Exam:     General: Well-appearing, cooperative, sitting comfortably in no acute distress.   OMT Physical Exam:  ASIS Compression Test: Positive Right Cervical: TTP paraspinal, *** Rib: Bilateral elevated first rib with TTP Thoracic: TTP paraspinal,*** Lumbar: TTP paraspinal,*** Pelvis: Right anterior innominate  Electronically signed by:  Devin Collins D.[facility] 7:49 AM 08/20/22

## 2022-08-21 ENCOUNTER — Ambulatory Visit (INDEPENDENT_AMBULATORY_CARE_PROVIDER_SITE_OTHER): Payer: 59 | Admitting: Sports Medicine

## 2022-08-21 VITALS — BP 122/82 | HR 94 | Ht 72.0 in | Wt 163.0 lb

## 2022-08-21 DIAGNOSIS — T1490XS Injury, unspecified, sequela: Secondary | ICD-10-CM | POA: Diagnosis not present

## 2022-08-21 DIAGNOSIS — G8220 Paraplegia, unspecified: Secondary | ICD-10-CM | POA: Diagnosis not present

## 2022-08-21 DIAGNOSIS — Z993 Dependence on wheelchair: Secondary | ICD-10-CM

## 2022-08-21 DIAGNOSIS — M9903 Segmental and somatic dysfunction of lumbar region: Secondary | ICD-10-CM | POA: Diagnosis not present

## 2022-08-21 DIAGNOSIS — G950 Syringomyelia and syringobulbia: Secondary | ICD-10-CM

## 2022-08-21 DIAGNOSIS — M9908 Segmental and somatic dysfunction of rib cage: Secondary | ICD-10-CM

## 2022-08-21 DIAGNOSIS — M9907 Segmental and somatic dysfunction of upper extremity: Secondary | ICD-10-CM

## 2022-08-21 DIAGNOSIS — M7918 Myalgia, other site: Secondary | ICD-10-CM

## 2022-08-21 DIAGNOSIS — M9902 Segmental and somatic dysfunction of thoracic region: Secondary | ICD-10-CM | POA: Diagnosis not present

## 2022-08-21 DIAGNOSIS — R252 Cramp and spasm: Secondary | ICD-10-CM

## 2022-08-21 DIAGNOSIS — M9905 Segmental and somatic dysfunction of pelvic region: Secondary | ICD-10-CM

## 2022-08-21 MED ORDER — MELOXICAM 15 MG PO TABS: 15.0000 mg | ORAL_TABLET | Freq: Every day | ORAL | 0 refills | Status: DC

## 2022-08-21 NOTE — Patient Instructions (Addendum)
Good to see you  Shoulder HEP - Start meloxicam 15 mg daily x2 weeks.  If still having pain after 2 weeks, complete 3rd-week of meloxicam. May use remaining meloxicam as needed once daily for pain control.  Do not to use additional NSAIDs while taking meloxicam.  May use Tylenol [phone] mg 2 to 3 times a day for breakthrough pain. 4-6 week follow up

## 2022-09-17 NOTE — Progress Notes (Unsigned)
[name] D.[facility] [address] [place] [address] Phone: [phone]   Assessment and Plan:     There are no diagnoses linked to this encounter.  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  Also has unusual sensations/spasms-. Here for f/u on SCI. With newly dx'd Anterocollis greater than torticollis-  L 4th digit DIP tendon rupture B/L carpal tunnel syndrome L ulnar compressive neuropathy. Got approved for power assist w/c- will get in next month   Things not good Still hurts across chest Has pain from R , B/L actually groin to low back and radiates up to top of R and L shoulder and into neck.  R buttock hurts the worst. Like knife sticking into R buttock Still having muscle roll even when coughs hard- Cannot reach for anything- because it will set off this pain as detailed above.    Also when leans forward in w/c- rolls down back to  buttocks/low back as well.  Had mammogram and colonoscopy- which came back negative.  Last CT of chest, abdomen and Pelvis has nothing about gallbladder.    Also spending most of day in bed due to pain- cannot drive easily, can't sit up in w/c for prolonged period (pt used to be in w/c 18+ hours/day); even affects breathing and sleeping due to pain.      08/21/2022 Patient is a 41 year old male complaining of back pain. patient states Soft manipulation- due to severe myofascial and R buttock pain found a big pocket of fluid when it was drained he still has pain  when he pushes down he can feel both muscsles  spread - has hx of Complete paraplegia, but still having pain in R buttocks and starts in shoulders/neck and radiates down to low back and R buttock- can transfer onto table- used to be in w/c 18+ hours/day and very strong- now in chair <4 hours/day due to pain, when he spreads his arms apart he can feel pain across the whole upper body    09/18/2022 Patient states    Relevant Historical Information: T5 paraplegia status post cervical stenosis/posterior fusion with spasticity, neurogenic bladder and bowel, and myofascial pain  Additional pertinent review of systems negative.   Current Outpatient Medications:    ascorbic Acid (VITAMIN C) 500 MG CPCR, Take 1 tablet by mouth daily., Disp: , Rfl:    baclofen (LIORESAL) 20 MG tablet, Take 1 tablet by mouth 3 (three) times daily., Disp: , Rfl:    cefadroxil (DURICEF) 500 MG capsule, Take by mouth., Disp: , Rfl:    diazepam (VALIUM) 5 MG tablet, Take 1 tablet (5 mg total) by mouth 3 (three) times daily. For spasticity, Disp: 90 tablet, Rfl: 5   gabapentin (NEURONTIN) 400 MG capsule, Take 400 mg by mouth 3 (three) times daily., Disp: , Rfl:    levofloxacin (LEVAQUIN) 750 MG tablet, Take 750 mg by mouth daily., Disp: , Rfl:    meloxicam (MOBIC) 15 MG tablet, Take 1 tablet (15 mg total) by mouth daily., Disp: 30 tablet, Rfl:  0   methenamine (HIPREX) 1 g tablet, Take by mouth., Disp: , Rfl:    Multiple Vitamin (MULTI-VITAMIN) tablet, Take by mouth., Disp: , Rfl:    naloxone (NARCAN) nasal spray 4 mg/0.1 mL, Place into the nose., Disp: , Rfl:    nystatin ointment (MYCOSTATIN), Apply topically 2 (two) times daily., Disp: , Rfl:    polyethylene glycol powder (GLYCOLAX/MIRALAX) 17 GM/SCOOP powder, Take 17 g by mouth daily., Disp: , Rfl:    Probiotic Product (MISC INTESTINAL FLORA REGULAT) CAPS, Take 1 capsule by mouth every morning., Disp: , Rfl:    tiZANidine (ZANAFLEX) 4 MG tablet, Take 2 tablets by mouth 3 (three) times daily., Disp: , Rfl:     Objective:     There were no vitals filed for this visit.    There is no height or weight on file to calculate BMI.    Physical Exam:    ***   Electronically signed by:  Benito Mccreedy D.[facility] 1:00 PM 09/17/22

## 2022-09-18 ENCOUNTER — Ambulatory Visit (INDEPENDENT_AMBULATORY_CARE_PROVIDER_SITE_OTHER): Payer: 59 | Admitting: Sports Medicine

## 2022-09-18 VITALS — BP 118/78 | HR 79 | Ht 72.0 in | Wt 163.0 lb

## 2022-09-18 DIAGNOSIS — Z993 Dependence on wheelchair: Secondary | ICD-10-CM | POA: Diagnosis not present

## 2022-09-18 DIAGNOSIS — M9907 Segmental and somatic dysfunction of upper extremity: Secondary | ICD-10-CM

## 2022-09-18 DIAGNOSIS — G8220 Paraplegia, unspecified: Secondary | ICD-10-CM

## 2022-09-18 DIAGNOSIS — M9908 Segmental and somatic dysfunction of rib cage: Secondary | ICD-10-CM

## 2022-09-18 DIAGNOSIS — M9903 Segmental and somatic dysfunction of lumbar region: Secondary | ICD-10-CM

## 2022-09-18 DIAGNOSIS — M9905 Segmental and somatic dysfunction of pelvic region: Secondary | ICD-10-CM

## 2022-09-18 DIAGNOSIS — G950 Syringomyelia and syringobulbia: Secondary | ICD-10-CM | POA: Diagnosis not present

## 2022-09-18 DIAGNOSIS — T1490XS Injury, unspecified, sequela: Secondary | ICD-10-CM | POA: Diagnosis not present

## 2022-09-18 DIAGNOSIS — M9901 Segmental and somatic dysfunction of cervical region: Secondary | ICD-10-CM

## 2022-09-18 DIAGNOSIS — M7918 Myalgia, other site: Secondary | ICD-10-CM

## 2022-09-18 DIAGNOSIS — M9902 Segmental and somatic dysfunction of thoracic region: Secondary | ICD-10-CM | POA: Diagnosis not present

## 2022-09-18 DIAGNOSIS — R252 Cramp and spasm: Secondary | ICD-10-CM

## 2022-09-18 NOTE — Patient Instructions (Addendum)
Good to see you  4-6 week follow up  

## 2022-10-09 ENCOUNTER — Encounter: Payer: 59 | Admitting: Physical Medicine and Rehabilitation

## 2022-10-15 NOTE — Progress Notes (Unsigned)
Benito Mccreedy D.[place] Phone: [phone]   Assessment and Plan:     There are no diagnoses linked to this encounter.  *** - Patient has received significant relief with OMT in the past.  Elects for repeat OMT today.  Tolerated well per note below. - Decision today to treat with OMT was based on Physical Exam   After verbal consent patient was treated with HVLA (high velocity low amplitude), ME (muscle energy), FPR (flex positional release), ST (soft tissue), PC/PD (Pelvic Compression/ Pelvic Decompression) techniques in cervical, rib, thoracic, lumbar, and pelvic areas. Patient tolerated the procedure well with improvement in symptoms.  Patient educated on potential side effects of soreness and recommended to rest, hydrate, and use Tylenol as needed for pain control.   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  Also has unusual sensations/spasms-. Here for f/u on SCI. With newly dx'd Anterocollis greater than torticollis-  L 4th digit DIP tendon rupture B/L carpal tunnel syndrome L ulnar compressive neuropathy. Got approved for power assist w/c- will get in next month   Things not good Still hurts across chest Has pain from R , B/L actually groin to low back and radiates up to top of R and L shoulder and into neck.  R buttock hurts the worst. Like knife sticking into R buttock Still having muscle roll even when coughs hard- Cannot reach for anything- because it will set off this pain as detailed above.    Also when leans forward in w/c-  rolls down back to  buttocks/low back as well.  Had mammogram and colonoscopy- which came back negative.  Last CT of chest, abdomen and Pelvis has nothing about gallbladder.    Also spending most of day in bed due to pain- cannot drive easily, can't sit up in w/c for prolonged period (pt used to be in w/c 18+ hours/day); even affects breathing and sleeping due to pain.      08/21/2022 Patient is a 42 year old male complaining of back pain. patient states Soft manipulation- due to severe myofascial and R buttock pain found a big pocket of fluid when it was drained he still has pain  when he pushes down he can feel both muscsles spread - has hx of Complete paraplegia, but still having pain in R buttocks and starts in shoulders/neck and radiates down to low back and R buttock- can transfer onto table- used to be in w/c 18+ hours/day and very strong- now in chair <4 hours/day due to pain, when he spreads his arms apart he can feel pain across the whole upper body     09/18/2022 Patient states he is doing alright    10/16/2022 Patient states    Relevant Historical Information: T5 paraplegia status post cervical stenosis/posterior fusion with spasticity, neurogenic bladder and bowel, and myofascial pain    Additional pertinent review of systems negative.  Current Outpatient Medications  Medication Sig Dispense Refill   ascorbic Acid (VITAMIN C) 500 MG CPCR Take 1 tablet by mouth daily.  baclofen (LIORESAL) 20 MG tablet Take 1 tablet by mouth 3 (three) times daily.     cefadroxil (DURICEF) 500 MG capsule Take by mouth.     diazepam (VALIUM) 5 MG tablet Take 1 tablet (5 mg total) by mouth 3 (three) times daily. For spasticity 90 tablet 5   gabapentin (NEURONTIN) 400 MG capsule Take 400 mg by mouth 3 (three) times daily.     levofloxacin (LEVAQUIN) 750 MG tablet Take 750 mg by mouth daily.     meloxicam (MOBIC) 15 MG tablet Take 1 tablet (15 mg total) by mouth daily. 30 tablet 0   methenamine  (HIPREX) 1 g tablet Take by mouth.     Multiple Vitamin (MULTI-VITAMIN) tablet Take by mouth.     naloxone (NARCAN) nasal spray 4 mg/0.1 mL Place into the nose.     nystatin ointment (MYCOSTATIN) Apply topically 2 (two) times daily.     polyethylene glycol powder (GLYCOLAX/MIRALAX) 17 GM/SCOOP powder Take 17 g by mouth daily.     Probiotic Product (MISC INTESTINAL FLORA REGULAT) CAPS Take 1 capsule by mouth every morning.     tiZANidine (ZANAFLEX) 4 MG tablet Take 2 tablets by mouth 3 (three) times daily.     No current facility-administered medications for this visit.      Objective:     There were no vitals filed for this visit.    There is no height or weight on file to calculate BMI.    Physical Exam:     General: Well-appearing, cooperative, sitting comfortably in no acute distress.   OMT Physical Exam:  ASIS Compression Test: Positive Right Cervical: TTP paraspinal, *** Rib: Bilateral elevated first rib with TTP Thoracic: TTP paraspinal,*** Lumbar: TTP paraspinal,*** Pelvis: Right anterior innominate  Electronically signed by:  [name] D.[facility] 12:33 PM 10/15/22

## 2022-10-16 ENCOUNTER — Ambulatory Visit (INDEPENDENT_AMBULATORY_CARE_PROVIDER_SITE_OTHER): Payer: 59 | Admitting: Sports Medicine

## 2022-10-16 ENCOUNTER — Encounter: Payer: Self-pay | Admitting: Physical Medicine and Rehabilitation

## 2022-10-16 ENCOUNTER — Encounter: Payer: 59 | Attending: Physical Medicine and Rehabilitation | Admitting: Physical Medicine and Rehabilitation

## 2022-10-16 VITALS — BP 152/73 | HR 90 | Ht 72.0 in

## 2022-10-16 VITALS — BP 138/80 | Ht 72.0 in | Wt 163.0 lb

## 2022-10-16 DIAGNOSIS — M9902 Segmental and somatic dysfunction of thoracic region: Secondary | ICD-10-CM

## 2022-10-16 DIAGNOSIS — T1490XS Injury, unspecified, sequela: Secondary | ICD-10-CM | POA: Insufficient documentation

## 2022-10-16 DIAGNOSIS — R252 Cramp and spasm: Secondary | ICD-10-CM | POA: Insufficient documentation

## 2022-10-16 DIAGNOSIS — Z993 Dependence on wheelchair: Secondary | ICD-10-CM

## 2022-10-16 DIAGNOSIS — M7918 Myalgia, other site: Secondary | ICD-10-CM

## 2022-10-16 DIAGNOSIS — E559 Vitamin D deficiency, unspecified: Secondary | ICD-10-CM | POA: Insufficient documentation

## 2022-10-16 DIAGNOSIS — G950 Syringomyelia and syringobulbia: Secondary | ICD-10-CM

## 2022-10-16 DIAGNOSIS — M9905 Segmental and somatic dysfunction of pelvic region: Secondary | ICD-10-CM

## 2022-10-16 DIAGNOSIS — M9903 Segmental and somatic dysfunction of lumbar region: Secondary | ICD-10-CM | POA: Diagnosis not present

## 2022-10-16 DIAGNOSIS — G8220 Paraplegia, unspecified: Secondary | ICD-10-CM | POA: Diagnosis not present

## 2022-10-16 DIAGNOSIS — M9901 Segmental and somatic dysfunction of cervical region: Secondary | ICD-10-CM | POA: Diagnosis not present

## 2022-10-16 DIAGNOSIS — M9908 Segmental and somatic dysfunction of rib cage: Secondary | ICD-10-CM

## 2022-10-16 DIAGNOSIS — M9907 Segmental and somatic dysfunction of upper extremity: Secondary | ICD-10-CM

## 2022-10-16 MED ORDER — LIDOCAINE HCL 1 % IJ SOLN
6.0000 mL | Freq: Once | INTRAMUSCULAR | Status: AC
  Administered 2022-10-16: 6 mL

## 2022-10-16 NOTE — Patient Instructions (Signed)
Good to see you   

## 2022-10-16 NOTE — Patient Instructions (Signed)
Plan:  Patient here for trigger point injections for myofascial pain  Consent done and on chart.  Cleaned areas with alcohol and injected using a 27 gauge 1.5 inch needle  Injected 9cc Using 1% Lidocaine with no EPI  Upper traps B/L  Levators B/L  Posterior scalenes B/L  Middle scalenes B/L  x2 Splenius Capitus B/L  Pectoralis Major- B/L Rhomboids B/L x3 Infraspinatus Teres Major/minor Thoracic paraspinals B/L x2 Lumbar paraspinals B/L x2 Other injections- B/L forearms, as well as hands B/L x2    There was no bleeding or complications.  Patient was advised to drink a lot of water on day after injections to flush system Will have increased soreness for 12-48 hours after injections.  Can use Lidocaine patches the day AFTER injections Can use theracane on day of injections in places didn't inject Can use heating pad 4-6 hours AFTER injections   2. Suggest speaking with ID about buttock pain- since last time had fluid needed to be drained.   3. Discussed GI, hemorrhoids  4. Please try and take Vit D- has a level of 14.2 - at least 5000 units/day- the range is 30-100- but goal is ~ 50-60, not 30's

## 2022-10-16 NOTE — Progress Notes (Signed)
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  Also has unusual sensations/spasms-. Here for f/u on SCI. With newly dx'd Anterocollis greater than torticollis-  L 4th digit DIP tendon rupture B/L carpal tunnel syndrome L ulnar compressive neuropathy. Got approved for power assist w/c- will get in next month Here for fu on paraplegia and associated pain issues and trigger point injections.   Had a spasm the other day, almost threw him out of chair.   When grips stuff, it runs down both sides of arms When stretches hands out, feels it pop and go up to neck-  When rolls w/c- feels like having constriction-  When rolls backwards, feels it loosen.  Having since last time he saw him.   Been Seeing Dr Glennon Mac- been working on muscles in neck more than anywhere- sees him today.    Hurts in buttocks where has pocket in fluid- was drained out, but hurting again. Started hurting again 3 weeks ago.   Also found out has broken rod T12-L1- not completely broken, but still real painful. Found by Dr Patience Musca- wants to f/u up in 6 months. 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   Plan:  Patient here for trigger point injections for myofascial pain  Consent done and on chart.  Cleaned areas with alcohol and injected using a 27 gauge 1.5 inch needle  Injected 9cc Using 1% Lidocaine with no EPI  Upper traps B/L  Levators B/L  Posterior scalenes B/L  Middle scalenes B/L  x2 Splenius Capitus B/L  Pectoralis Major- B/L Rhomboids B/L x3 Infraspinatus Teres Major/minor Thoracic paraspinals B/L x2 Lumbar paraspinals B/L x2 Other injections- B/L forearms, as well as hands B/L x2    There was no bleeding or complications.  Patient was advised to drink a lot of  water on day after injections to flush system Will have increased soreness for 12-48 hours after injections.  Can use Lidocaine patches the day AFTER injections Can use theracane on day of injections in places didn't inject Can use heating pad 4-6 hours AFTER injections   2. Suggest speaking with ID about buttock pain- since last time had fluid needed to be drained.   3. Discussed GI, hemorrhoids  4. Please try and take Vit D- has a level of 14.2 - at least 5000 units/day- the range is 30-100- but goal is ~ 50-60, not 30's  I spent a total of 40   minutes on total care today- >50% coordination of care- due to 10 minutes on injections and 30 minutes discussing Vit D and how it affects pain; also ID/buttock pain- trying to find who can order another CT for him.

## 2022-11-17 ENCOUNTER — Other Ambulatory Visit: Payer: Self-pay | Admitting: Physical Medicine and Rehabilitation

## 2022-11-26 NOTE — Progress Notes (Deleted)
Devin Collins [place] Phone: [phone]   Assessment and Plan:     There are no diagnoses linked to this encounter.  *** - Patient has received significant relief with OMT in the past.  Elects for repeat OMT today.  Tolerated well per note below. - Decision today to treat with OMT was based on Physical Exam   After verbal consent patient was treated with HVLA (high velocity low amplitude), ME (muscle energy), FPR (flex positional release), ST (soft tissue), PC/PD (Pelvic Compression/ Pelvic Decompression) techniques in cervical, rib, thoracic, lumbar, and pelvic areas. Patient tolerated the procedure well with improvement in symptoms.  Patient educated on potential side effects of soreness and recommended to rest, hydrate, and use Tylenol as needed for pain control.   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  Also has unusual sensations/spasms-. Here for f/u on SCI. With newly dx'd Anterocollis greater than torticollis-  L 4th digit DIP tendon rupture B/L carpal tunnel syndrome L ulnar compressive neuropathy. Got approved for power assist w/c- will get in next month   Things not good Still hurts across chest Has pain from R , B/L actually groin to low back and radiates up to top of R and L shoulder and into neck.  R buttock hurts the worst. Like knife sticking into R buttock Still having muscle roll even when coughs hard- Cannot reach for anything- because it will set off this pain as detailed above.    Also when leans forward in w/c-  rolls down back to  buttocks/low back as well.  Had mammogram and colonoscopy- which came back negative.  Last CT of chest, abdomen and Pelvis has nothing about gallbladder.    Also spending most of day in bed due to pain- cannot drive easily, can't sit up in w/c for prolonged period (pt used to be in w/c 18+ hours/day); even affects breathing and sleeping due to pain.      08/21/2022 Patient is a 42 year old male complaining of back pain. patient states Soft manipulation- due to severe myofascial and R buttock pain found a big pocket of fluid when it was drained he still has pain  when he pushes down he can feel both muscsles spread - has hx of Complete paraplegia, but still having pain in R buttocks and starts in shoulders/neck and radiates down to low back and R buttock- can transfer onto table- used to be in w/c 18+ hours/day and very strong- now in chair <4 hours/day due to pain, when he spreads his arms apart he can feel pain across the whole upper body     09/18/2022 Patient states he is doing alright    10/16/2022 Patient states just got trigger points is feeling good , tune up, went to back doc and has a fx in T12 and L1    11/27/2022 Patient states    Relevant Historical Information: T5 paraplegia status post cervical stenosis/posterior fusion with spasticity, neurogenic bladder and bowel, and myofascial pain  Additional pertinent review of systems negative.  Current Outpatient Medications  Medication Sig Dispense Refill   ascorbic Acid (VITAMIN C) 500 MG CPCR Take 1 tablet by mouth daily.     baclofen (LIORESAL) 20 MG tablet Take 1 tablet by mouth 3 (three) times daily.     cefadroxil (DURICEF) 500 MG capsule Take by mouth.     diazepam (VALIUM) 5 MG tablet Take 1 tablet (5 mg total) by mouth 3 (three) times daily. For spasticity 90 tablet 5   gabapentin (NEURONTIN) 400 MG capsule Take 400 mg by mouth 3 (three) times daily.     levofloxacin (LEVAQUIN) 750 MG tablet Take 750 mg  by mouth daily.     meloxicam (MOBIC) 15 MG tablet Take 1 tablet (15 mg total) by mouth daily. 30 tablet 0   methenamine (HIPREX) 1 g tablet Take by mouth.     Multiple Vitamin (MULTI-VITAMIN) tablet Take by mouth.     naloxone (NARCAN) nasal spray 4 mg/0.1 mL Place into the nose.     nystatin ointment (MYCOSTATIN) Apply topically 2 (two) times daily.     polyethylene glycol powder (GLYCOLAX/MIRALAX) 17 GM/SCOOP powder Take 17 g by mouth daily.     Probiotic Product (MISC INTESTINAL FLORA REGULAT) CAPS Take 1 capsule by mouth every morning.     tiZANidine (ZANAFLEX) 4 MG tablet Take 2 tablets by mouth 3 (three) times daily.     No current facility-administered medications for this visit.      Objective:     There were no vitals filed for this visit.    There is no height or weight on file to calculate BMI.    Physical Exam:     General: Well-appearing, cooperative, sitting comfortably in no acute distress.   OMT Physical Exam:  ASIS Compression Test: Positive Right Cervical: TTP paraspinal, *** Rib: Bilateral elevated first rib with TTP Thoracic: TTP paraspinal,*** Lumbar: TTP paraspinal,*** Pelvis: Right anterior innominate  Electronically signed by:  Devin Collins D.[facility] 9:18 AM 11/26/22

## 2022-11-27 ENCOUNTER — Encounter: Payer: 59 | Admitting: Physical Medicine and Rehabilitation

## 2022-11-27 ENCOUNTER — Ambulatory Visit: Payer: 59 | Admitting: Sports Medicine

## 2022-12-20 ENCOUNTER — Encounter: Payer: 59 | Attending: Physical Medicine and Rehabilitation | Admitting: Physical Medicine and Rehabilitation

## 2022-12-20 ENCOUNTER — Encounter: Payer: Self-pay | Admitting: Physical Medicine and Rehabilitation

## 2022-12-20 VITALS — BP 126/72 | HR 77

## 2022-12-20 DIAGNOSIS — M7918 Myalgia, other site: Secondary | ICD-10-CM | POA: Diagnosis not present

## 2022-12-20 DIAGNOSIS — G8220 Paraplegia, unspecified: Secondary | ICD-10-CM | POA: Insufficient documentation

## 2022-12-20 MED ORDER — LIDOCAINE HCL 1 % IJ SOLN
9.0000 mL | Freq: Once | INTRAMUSCULAR | Status: AC
  Administered 2022-12-20: 9 mL

## 2022-12-20 NOTE — Patient Instructions (Signed)
Plan: Next time buy Vit D, buy 5000 units- to help with pain/fatigue issues.   2. Patient here for trigger point injections for  Consent done and on chart.  Cleaned areas with alcohol and injected using a 27 gauge 1.5 inch needle  Injected 9cc Using 1% Lidocaine with no EPI  Upper traps B/L  Levators- B/L  Posterior scalenes- B/L x2 Middle scalenes- B/L  Splenius Capitus- B/L  Pectoralis Major- B/L  Rhomboids- B/L x2 Infraspinatus Teres Major/minor- B/L  Thoracic paraspinals- B/L x2 Lumbar paraspinals- B/L  Other injections- B/L hands x2 sites   Patient's level of pain prior was 8-9/10 Current level of pain after injections is 5-6/10 neck can turn to drive now  There was no bleeding or complications.  Patient was advised to drink a lot of water on day after injections to flush system Will have increased soreness for 12-48 hours after injections.  Can use Lidocaine patches the day AFTER injections Can use theracane on day of injections in places didn't inject Can use heating pad 4-6 hours AFTER injections  3. F/U with ID for draining the buttock - be careful so doesn't get pressure ulcer- had tube to drain in past- makes sure the tube stays in right place.    4. Con't Valium- was sent in 2/24-   5. Con't Baclofen- gets form Dr Thurmond Butts- con't dose.    6. Con't Zanaflex- and Gabapentin- from PCP   7. Pain meds are Dr Mechele Dawley- con't per their clinic.   8. It appropriate to do C2 nerve block, however take more Valium- even if a little sedated for injection. Have [name] drive you.   9. F/U in  6 weeks.

## 2022-12-20 NOTE — Progress Notes (Signed)
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  Also has unusual sensations/spasms-. Here for f/u on SCI. With newly dx'd Anterocollis greater than torticollis-  L 4th digit DIP tendon rupture B/L carpal tunnel syndrome L ulnar compressive neuropathy. Got approved for power assist w/c- will get in next month Here for fu on paraplegia and associated pain issues and trigger point injections.      Have cyst on Buttocks- pocket of fluid- filled back up-  Not on ABX- didn't get last time either.  Thinks  it's from "spinning" in w/c.   On Cefodroxil for prevention for neck.   When spread arms and stretches out- gets ulnar nerve stretch and muscles spasms   Getting really weak in L arm-  Dr Mamie Nick- an Ortho at [facility]- wants to do some type of ulnar surgery - ( reviewed charting of Ortho back to 3/23- cannot find note talking about this).    Is taking Vit D- takes 4000 units/day- not 5000 units.    Plan: Next time buy Vit D, buy 5000 units- to help with pain/fatigue issues.   2. Patient here for trigger point injections for  Consent done and on chart.  Cleaned areas with alcohol and injected using a 27 gauge 1.5 inch needle  Injected 9cc Using 1% Lidocaine with no EPI  Upper traps B/L  Levators- B/L  Posterior scalenes- B/L x2 Middle scalenes- B/L  Splenius Capitus- B/L  Pectoralis Major- B/L  Rhomboids- B/L x2 Infraspinatus Teres Major/minor- B/L  Thoracic paraspinals- B/L x2 Lumbar paraspinals- B/L  Other injections- B/L hands x2 sites   Patient's level of pain prior was 8-9/10 Current level of pain after injections is 5-6/10 neck can turn to drive now  There was no bleeding or complications.  Patient was advised to drink a lot of water on day after injections to flush system Will have increased  soreness for 12-48 hours after injections.  Can use Lidocaine patches the day AFTER injections Can use theracane on day of injections in places didn't inject Can use heating pad 4-6 hours AFTER injections  3. F/U with ID for draining the buttock - be careful so doesn't get pressure ulcer- had tube to drain in past- makes sure the tube stays in right place.    4. Con't Valium- was sent in 2/24-   5. Con't Baclofen- gets form Dr Thurmond Butts- con't dose.    6. Con't Zanaflex- and Gabapentin- from PCP   7. Pain meds are Dr Mechele Dawley- con't per their clinic.   8. It appropriate to do C2 nerve block, however take more Valium- even if a little sedated for injection. Have [name] drive you.   9. F/U in  6 weeks.   10. Doesn't want Botox again right now  I spent a total of 32   minutes on total care today- >50% coordination of care- due to educaiton on Vit D, trigger point injections - 10 minutes- and and discussing nerve block and other meds and buttock drainage.

## 2023-01-08 ENCOUNTER — Ambulatory Visit: Payer: 59 | Admitting: Physical Medicine and Rehabilitation

## 2023-01-15 ENCOUNTER — Encounter: Payer: 59 | Attending: Physical Medicine and Rehabilitation | Admitting: Physical Medicine and Rehabilitation

## 2023-01-15 ENCOUNTER — Encounter: Payer: Self-pay | Admitting: Physical Medicine and Rehabilitation

## 2023-01-15 VITALS — BP 112/68 | HR 84 | Ht 72.0 in | Wt 165.0 lb

## 2023-01-15 DIAGNOSIS — R252 Cramp and spasm: Secondary | ICD-10-CM

## 2023-01-15 DIAGNOSIS — M869 Osteomyelitis, unspecified: Secondary | ICD-10-CM

## 2023-01-15 DIAGNOSIS — M7918 Myalgia, other site: Secondary | ICD-10-CM | POA: Diagnosis not present

## 2023-01-15 DIAGNOSIS — Z993 Dependence on wheelchair: Secondary | ICD-10-CM

## 2023-01-15 DIAGNOSIS — G8220 Paraplegia, unspecified: Secondary | ICD-10-CM

## 2023-01-15 MED ORDER — LIDOCAINE HCL 1 % IJ SOLN
9.0000 mL | Freq: Once | INTRAMUSCULAR | Status: AC
  Administered 2023-01-15: 9 mL

## 2023-01-15 MED ORDER — LIDOCAINE HCL 1 % IJ SOLN: 9.0000 mL | Freq: Once | INTRAMUSCULAR | Status: DC

## 2023-01-15 NOTE — Addendum Note (Signed)
Addended by: Janean Sark on: 01/15/2023 04:11 PM   Modules accepted: Orders

## 2023-01-15 NOTE — Progress Notes (Signed)
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  Also has unusual sensations/spasms-. Here for f/u on SCI. With newly dx'd Anterocollis greater than torticollis-  L 4th digit DIP tendon rupture B/L carpal tunnel syndrome L ulnar compressive neuropathy. Got approved for power assist w/c- will get in next month Here for fu on paraplegia and associated pain issues and trigger point injections.         Hurts on side of head when "pops" R chest/breast.  Start sin R chest and runs all the way up side of neck to head.   When squeezes both pecs, feels like av in middle will pop as well Hurts  right around diaphragm all the time-    Hurts more on R side side, but starting to go to L side.   Last saw Dr Jean Rosenthal- 2 months ago- was on vacation last time he was here- needs appointment next visit.   Laying on stomach and stretching- helps a little, but not enough.   When moves, can feel muscles move in shoulders- and runs around back  Has started doing/working out doing shoulders- and lat work-  Still leaning to left with therapy/sitting up in bed.  When sitting up, muscles are releasing per PT- gets more progress laying down, not sititng up Going to PT 2x/week- x 8 weeks.  Rotator cuffs are OK and hand grip strength is OK.  Gripping 100 lbs in L hand, but hurts to lift- and feels muscle tightness that catches and rolls and throws him off balance in   Hurts in chest and around back no matter what-  Can feel abdominal muscles "unroll" when does pressure relief.  Also can feel spasms in R leg that runs down to hamstring.  And hurts in groin as well-  "Feels muscle unwind" and then feels fine once muscles "unroll".   Been >1 year since got Botox of his ileopsoas- was having spasms last time, but having less/fewer spasms.     Been in w/c more lately- 10-12 hours/day.  Last time I saw him, was spending a lot more time in bed. However,  Decided more active he is; the less it hurts.   Didn't get nerve block- for neck- since doctor was running 3 hours behind.  Doesn't have rescheduled. But sees them 4/24 this month for f/u appt.   Didn't bring power assist w/c today- brought old w/c.   Had UTI- over it- just finished levaquin 2 days ago.    Social Hx:  Might be moving back to [place]- $400 cheaper in rent. Will take dogs and has big yard.    Plan:  Patient here for trigger point injections for myofascial pain  Consent done and on chart.  Cleaned areas with alcohol and injected using a 27 gauge 1.5 inch needle  Injected 9cc- no wastage Using 1% Lidocaine with no EPI  Upper traps- B/L  x2 Levators- BL  Posterior scalenes Middle scalenes- B/L  Splenius Capitus- B/L  Pectoralis Major B/L x2 Rhomboids B/L x3 Infraspinatus Teres Major/minor Thoracic paraspinals B/L  Lumbar paraspinals- B/L  Other injections-  B/L hands x2; B/L forearms and B/L triceps as well as R lateral hip- twitch responses a lot more today   Patient's level of pain prior was 7-8/10 Current level of pain after injections is down to 4-5/10 There  was no bleeding or complications.  Patient was advised to drink a lot of water on day after injections to flush system Will have increased soreness for 12-48 hours after injections.  Can use Lidocaine patches the day AFTER injections Can use theracane on day of injections in places didn't inject Can use heating pad 4-6 hours AFTER injections  2. Con't Valium for spasticity- 5 mg TID- has refills  3. Con't Baclofen, gabapentin and Zanaflex- from PCP.   4. Doesn't feel like needs ileo-psoas Botox  5. F/U- 6 weeks-alternate single and double appt- and TrP injections   6. Discussed osteomyelitis- possible in R buttock- because has fluid- MRI the most sensitive to look for  osteomyelitis. Doesn't have wound- but has fluid collection that has recurred in R buttock that ID is concerned for osteomyelitis- due to paraplegia, cannot feel the same way, so hard to know- need MRI to determine what's going on.  With him having a lot of hardware, we need to know/treat- might need IV ABX or long term PO ABX due to his hardware in multiple places in spine. Could also be cause of leaning, which increase risk of pressure ulcers.    I spent a total of  39  minutes on total care today- >50% coordination of care- due to 9 minutes on TrP injections and 30 minutes discussing UTI, current medical issues and suspected osteomyelitis of R buttock/pelvis.

## 2023-01-15 NOTE — Patient Instructions (Addendum)
Plan:  Patient here for trigger point injections for myofascial pain  Consent done and on chart.  Cleaned areas with alcohol and injected using a 27 gauge 1.5 inch needle  Injected 9cc- no wastage Using 1% Lidocaine with no EPI  Upper traps- B/L  x2 Levators- BL  Posterior scalenes Middle scalenes- B/L  Splenius Capitus- B/L  Pectoralis Major B/L x2 Rhomboids B/L x3 Infraspinatus Teres Major/minor Thoracic paraspinals B/L  Lumbar paraspinals- B/L  Other injections-  B/L hands x2; B/L forearms and B/L triceps as well as R lateral hip- twitch responses a lot more today   Patient's level of pain prior was 7-8/10 Current level of pain after injections is down to 4-5/10 There was no bleeding or complications.  Patient was advised to drink a lot of water on day after injections to flush system Will have increased soreness for 12-48 hours after injections.  Can use Lidocaine patches the day AFTER injections Can use theracane on day of injections in places didn't inject Can use heating pad 4-6 hours AFTER injections  2. Con't Valium for spasticity- 5 mg TID- has refills  3. Con't Baclofen, gabapentin and Zanaflex- from PCP.   4. Doesn't feel like needs ileo-psoas Botox  5. F/U- 6 weeks-   6. Discussed osteomyelitis- possible in R buttock- because has fluid- MRI the most sensitive to look for osteomyelitis. Doesn't have wound- but has fluid collection that has recurred in R buttock that ID is concerned for osteomyelitis- due to paraplegia, cannot feel the same way, so hard to know- need MRI to determine what's going on. With him having a lot of hardware, we need to know/treat- might need IV ABX or long term PO ABX due to his hardware in multiple places in spine. Could also be cause of leaning, which increase risk of pressure ulcers.

## 2023-01-20 ENCOUNTER — Other Ambulatory Visit: Payer: Self-pay | Admitting: Physical Medicine and Rehabilitation

## 2023-01-20 DIAGNOSIS — M869 Osteomyelitis, unspecified: Secondary | ICD-10-CM

## 2023-01-23 ENCOUNTER — Ambulatory Visit (HOSPITAL_COMMUNITY)
Admission: RE | Admit: 2023-01-23 | Discharge: 2023-01-23 | Disposition: A | Discharge status: Home / Self Care | Payer: 59 | Source: Ambulatory Visit | Attending: Physical Medicine and Rehabilitation | Admitting: Physical Medicine and Rehabilitation

## 2023-01-23 DIAGNOSIS — M869 Osteomyelitis, unspecified: Secondary | ICD-10-CM | POA: Diagnosis not present

## 2023-01-23 MED ORDER — GADOBUTROL 1 MMOL/ML IV SOLN
7.5000 mL | Freq: Once | INTRAVENOUS | Status: AC | PRN
  Administered 2023-01-23: 7.5 mL via INTRAVENOUS

## 2023-01-27 ENCOUNTER — Telehealth: Payer: Self-pay | Admitting: *Deleted

## 2023-01-27 NOTE — Telephone Encounter (Signed)
Stat call from [facility] @ [place] radiology. Please review MRI on 01/23/23

## 2023-01-27 NOTE — Telephone Encounter (Signed)
Called pt and asked him to calll ID in AM- needs to be put on IV ABX-   Probably won't need surgery if no drainage- per Dr Parks Ranger, Plastics-

## 2023-02-26 ENCOUNTER — Encounter: Payer: 59 | Admitting: Physical Medicine and Rehabilitation

## 2023-02-26 ENCOUNTER — Ambulatory Visit: Payer: 59 | Admitting: Sports Medicine

## 2023-03-05 ENCOUNTER — Encounter: Payer: 59 | Admitting: Physical Medicine and Rehabilitation

## 2023-03-05 ENCOUNTER — Encounter: Payer: 59 | Attending: Physical Medicine and Rehabilitation | Admitting: Physical Medicine and Rehabilitation

## 2023-03-05 ENCOUNTER — Encounter: Payer: Self-pay | Admitting: Physical Medicine and Rehabilitation

## 2023-03-05 VITALS — BP 103/64 | HR 94 | Temp 98.5°F | Ht 72.0 in

## 2023-03-05 DIAGNOSIS — M25551 Pain in right hip: Secondary | ICD-10-CM | POA: Insufficient documentation

## 2023-03-05 DIAGNOSIS — R252 Cramp and spasm: Secondary | ICD-10-CM | POA: Insufficient documentation

## 2023-03-05 DIAGNOSIS — G8220 Paraplegia, unspecified: Secondary | ICD-10-CM | POA: Insufficient documentation

## 2023-03-05 DIAGNOSIS — M7918 Myalgia, other site: Secondary | ICD-10-CM | POA: Diagnosis not present

## 2023-03-05 DIAGNOSIS — M25552 Pain in left hip: Secondary | ICD-10-CM | POA: Insufficient documentation

## 2023-03-05 DIAGNOSIS — Z993 Dependence on wheelchair: Secondary | ICD-10-CM | POA: Diagnosis present

## 2023-03-05 MED ORDER — LIDOCAINE HCL 1 % IJ SOLN
9.0000 mL | Freq: Once | INTRAMUSCULAR | Status: AC
Start: 2023-03-05 — End: 2023-03-07
  Administered 2023-03-07: 9 mL

## 2023-03-05 NOTE — Patient Instructions (Signed)
Plan: Patient here for trigger point injections for  Consent done and on chart.  Cleaned areas with alcohol and injected using a 27 gauge 1.5 inch needle  Injected 9cc Using 1% Lidocaine with no EPI  Upper traps B/L  Levators- B/L  Posterior scalenes- B/L x2 Middle scalenes- B/L  Splenius Capitus- B/L  Pectoralis Major- B/L  x2 Rhomboids B/L x3 Infraspinatus Teres Major/minor Thoracic paraspinals B/L  Lumbar paraspinals B/L x2 Other injections-  B/l triceps and B/L hands   Patient's level of pain prior was Current level of pain after injections is  There was no bleeding or complications.  Patient was advised to drink a lot of water on day after injections to flush system Will have increased soreness for 12-48 hours after injections.  Can use Lidocaine patches the day AFTER injections Can use theracane on day of injections in places didn't inject Can use heating pad 4-6 hours AFTER injections  2. Discussed getting R buttocks- surgical intervention..   3. On 1 chambered ROHO- needs 4 chambered- have called [employer]- waiting to hear.    4.  F/U in 1.5 month- f/u on SCI /and trigger point injections.

## 2023-03-05 NOTE — Progress Notes (Signed)
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  Also has unusual sensations/spasms-. Here for f/u on SCI. With newly dx'd Anterocollis greater than torticollis-  L 4th digit DIP tendon rupture B/L carpal tunnel syndrome L ulnar compressive neuropathy. Got approved for power assist w/c- will get in next month Here for fu on paraplegia and associated pain issues and trigger point injections.  Dr Donald Siva thinks needs surgery to get "bursa "cut out-  Right on ischium.   Hurts so bad to roll- using push A for rolling w/c everywhere.   Hates waking up.  Runs up legs/muscles and into waist and then up to head.    Pain is SO bad- cannot tolerate it- doesn't want to wake up.   TrP injections only lasting ~ 1 week.   When lays on abdomen- cannot get buttocks to relax/come down to being flat- but usually up  at triangle.   ID and IR going to do round table- to see if should do surgery on buttock.    Feels like needs to sit angled- with R hip forward- and back rotated.  Also hurts in abdomen- the abdominal muscles hurting really badly. Down to penis.   Keeps R shoulder forward- and then puts into neutral, hurts like pushing rods and pushing on buttocks.   Feels like abdomen is distended- and very painful-  [facility] did xray of neck and rods- everything stable.   Fell 3 weeks ago- flipped w/c- and landed in floor- put back on wrong.    Son finally got a job, but just started work at new job.    Plan: Patient here for trigger point injections for  Consent done and on chart.  Cleaned areas with alcohol and injected using a 27 gauge 1.5 inch needle  Injected 9cc Using 1% Lidocaine with no EPI  Upper traps B/L  Levators- B/L  Posterior scalenes- B/L x2 Middle scalenes- B/L  Splenius Capitus- B/L  Pectoralis Major- B/L   x2 Rhomboids B/L x3 Infraspinatus Teres Major/minor Thoracic paraspinals B/L  Lumbar paraspinals B/L x2 Other injections-  B/l triceps and B/L hands   Patient's level of pain prior was Current level of pain after injections is  There was no bleeding or complications.  Patient was advised to drink a lot of water on day after injections to flush system Will have increased soreness for 12-48 hours after injections.  Can use Lidocaine patches the day AFTER injections Can use theracane on day of injections in places didn't inject Can use heating pad 4-6 hours AFTER injections  2. Discussed getting R buttocks- surgical intervention..   3. On 1 chambered ROHO- needs 4 chambered- have called [employer] [facility]- waiting to hear.    4.  F/U in 1.5 month- f/u on SCI /and trigger point injections.   I spent a total of 38   minutes on total care today- >50% coordination of care- due to 8 minutes on injections and 30 minutes on f/u overall- d/w pt about possible surgery- R ischial tuberosity/R buttocks

## 2023-03-07 DIAGNOSIS — G8220 Paraplegia, unspecified: Secondary | ICD-10-CM | POA: Diagnosis not present

## 2023-03-26 ENCOUNTER — Encounter: Payer: 59 | Admitting: Physical Medicine and Rehabilitation

## 2023-03-28 ENCOUNTER — Encounter: Payer: Self-pay | Admitting: Physical Medicine and Rehabilitation

## 2023-03-28 ENCOUNTER — Encounter: Payer: 59 | Attending: Physical Medicine and Rehabilitation | Admitting: Physical Medicine and Rehabilitation

## 2023-03-28 VITALS — BP 107/61 | HR 104 | Ht 72.0 in

## 2023-03-28 DIAGNOSIS — G8220 Paraplegia, unspecified: Secondary | ICD-10-CM | POA: Insufficient documentation

## 2023-03-28 DIAGNOSIS — M7918 Myalgia, other site: Secondary | ICD-10-CM | POA: Diagnosis present

## 2023-03-28 DIAGNOSIS — Z993 Dependence on wheelchair: Secondary | ICD-10-CM | POA: Diagnosis present

## 2023-03-28 MED ORDER — LIDOCAINE HCL 1 % IJ SOLN
9.0000 mL | Freq: Once | INTRAMUSCULAR | Status: AC
Start: 2023-03-28 — End: 2023-03-28
  Administered 2023-03-28: 9 mL

## 2023-03-28 NOTE — Progress Notes (Signed)
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  Also has unusual sensations/spasms-. Here for f/u on SCI. With newly dx'd Anterocollis greater than torticollis-  L 4th digit DIP tendon rupture B/L carpal tunnel syndrome L ulnar compressive neuropathy. Got approved for power assist w/c- will get in next month Here for fu on paraplegia and associated pain issues and trigger point injections.  Getting an MRI on buttock-  Going to see Dr Josefine Class if needs to be operated on- to see if it's gotten smaller.   Feels like unwinding when sits forward   [place] Landlord said if stays ?1 year, will pull concrete pad so doesn't have to do gravel  [name] now has office space in [place] and room for king sized bed.   And has yard.   Took 50+ minutes to get here. Gas tank "hurting pocketbook".   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   Wants trigger point injections today.   Son broke phone last Friday- and hasn't been able to speak with son since then.   Got 4 chambered cushion from [employer] Works better- doesn't feel pressure in middle of buttocks when leaning.   Plan: Patient here for trigger point injections for  Consent done and on chart.  Cleaned areas with alcohol and injected using a 27 gauge 1.5 inch needle  Injected 10cc- none wasted Using 1% Lidocaine with no EPI  Upper traps B/L Levators B/L  Posterior scalenes B/L  Middle scalenes- B/L  Splenius Capitus- B/L  Pectoralis Major- B/L x2 Rhomboids- B/L x3 Infraspinatus Teres Major/minor- B/L  Thoracic paraspinals- B/L x2 Lumbar paraspinals B/L x2 Other injections- B/L hands and deltoids   Patient's level of pain prior was 9/10 Current level of pain after injections is doing better afterwards  There was no bleeding or  complications.  Patient was advised to drink a lot of water on day after injections to flush system Will have increased soreness for 12-48 hours after injections.  Can use Lidocaine patches the day AFTER injections Can use theracane on day of injections in places didn't inject Can use heating pad 4-6 hours AFTER injections  2/ F/U in 4 weeks-  I spent a total of  32   minutes on total care today- >50% coordination of care- due to d/w pt about leaning, buttocks abscess- and plans for surgery and cushion.  8 minutes on injections.

## 2023-03-28 NOTE — Patient Instructions (Signed)
Plan: Patient here for trigger point injections for  Consent done and on chart.  Cleaned areas with alcohol and injected using a 27 gauge 1.5 inch needle  Injected 10cc- none wasted Using 1% Lidocaine with no EPI  Upper traps B/L Levators B/L  Posterior scalenes B/L  Middle scalenes- B/L  Splenius Capitus- B/L  Pectoralis Major- B/L x2 Rhomboids- B/L x3 Infraspinatus Teres Major/minor- B/L  Thoracic paraspinals- B/L x2 Lumbar paraspinals B/L x2 Other injections- B/L hands and deltoids   Patient's level of pain prior was 9/10 Current level of pain after injections is doing better afterwards  There was no bleeding or complications.  Patient was advised to drink a lot of water on day after injections to flush system Will have increased soreness for 12-48 hours after injections.  Can use Lidocaine patches the day AFTER injections Can use theracane on day of injections in places didn't inject Can use heating pad 4-6 hours AFTER injections  2/ F/U in 6 weeks-

## 2023-04-02 ENCOUNTER — Ambulatory Visit: Payer: 59 | Admitting: Physical Medicine and Rehabilitation

## 2023-05-07 ENCOUNTER — Encounter: Payer: Self-pay | Admitting: Physical Medicine and Rehabilitation

## 2023-05-07 ENCOUNTER — Encounter: Payer: 59 | Attending: Physical Medicine and Rehabilitation | Admitting: Physical Medicine and Rehabilitation

## 2023-05-07 VITALS — BP 114/75 | HR 86

## 2023-05-07 DIAGNOSIS — R252 Cramp and spasm: Secondary | ICD-10-CM | POA: Diagnosis present

## 2023-05-07 DIAGNOSIS — Z993 Dependence on wheelchair: Secondary | ICD-10-CM | POA: Diagnosis present

## 2023-05-07 DIAGNOSIS — G8220 Paraplegia, unspecified: Secondary | ICD-10-CM | POA: Insufficient documentation

## 2023-05-07 DIAGNOSIS — M7918 Myalgia, other site: Secondary | ICD-10-CM | POA: Insufficient documentation

## 2023-05-07 MED ORDER — LIDOCAINE HCL 1 % IJ SOLN
9.0000 mL | Freq: Once | INTRAMUSCULAR | Status: AC
Start: 2023-05-07 — End: 2023-05-07
  Administered 2023-05-07: 9 mL

## 2023-05-07 NOTE — Patient Instructions (Signed)
  Patient here for trigger point injections for  Consent done and on chart.  Cleaned areas with alcohol and injected using a 27 gauge 1.5 inch needle  Injected 9cc- no wastage Using 1% Lidocaine with no EPI  Upper traps B/L  Levators- B/L  Posterior scalenes Middle scalenes B/L - x2- esp post auricular Splenius Capitus B/L  Pectoralis Major B/L x2 Rhomboids B/L x2 Infraspinatus Teres Major/minor- B/L  Thoracic paraspinals B/L x2 Lumbar paraspinals Other injections-    Patient's level of pain prior was 8-9/10 Current level of pain after injections is 5/10  There was no bleeding or complications.  Patient was advised to drink a lot of water on day after injections to flush system Will have increased soreness for 12-48 hours after injections.  Can use Lidocaine patches the day AFTER injections Can use theracane on day of injections in places didn't inject Can use heating pad 4-6 hours AFTER injections  2. F/u q4 weeks- for trigger point injections   3. Doesn't need refills on Baclofen or other spasticity meds- con't regimen

## 2023-05-07 NOTE — Addendum Note (Signed)
Addended by: Silas Sacramento T on: 05/07/2023 10:01 AM   Modules accepted: Orders

## 2023-05-07 NOTE — Progress Notes (Signed)
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  Also has unusual sensations/spasms-. Here for f/u on SCI. With newly dx'd Anterocollis greater than torticollis-  L 4th digit DIP tendon rupture B/L carpal tunnel syndrome L ulnar compressive neuropathy. Got approved for power assist w/c- will get in next month Here for fu on paraplegia and associated pain issues and trigger point injections.  Trigger point from R groin Goes around side of R head and causing HA-   R knee stands up "higher" than L side.   Wife doing well-  Ready  for trigger point injections.    Plan: Patient here for trigger point injections for  Consent done and on chart.  Cleaned areas with alcohol and injected using a 27 gauge 1.5 inch needle  Injected 9cc- no wastage Using 1% Lidocaine with no EPI  Upper traps B/L  Levators- B/L  Posterior scalenes Middle scalenes B/L - x2- esp post auricular Splenius Capitus B/L  Pectoralis Major B/L x2 Rhomboids B/L x2 Infraspinatus Teres Major/minor- B/L  Thoracic paraspinals B/L x2 Lumbar paraspinals Other injections-    Patient's level of pain prior was 8-9/10 Current level of pain after injections is 5/10  There was no bleeding or complications.  Patient was advised to drink a lot of water on day after injections to flush system Will have increased soreness for 12-48 hours after injections.  Can use Lidocaine patches the day AFTER injections Can use theracane on day of injections in places didn't inject Can use heating pad 4-6 hours AFTER injections  2. F/u q4 weeks- for trigger point injections   3. Doesn't need refills on Baclofen or other spasticity meds- con't regimen  4. Place PT referral- and wait on Aquatic therapy- with Ladona Ridgel- for core strength- and improve mobility. D/w pt and  ordered for him

## 2023-05-15 ENCOUNTER — Other Ambulatory Visit: Payer: Self-pay | Admitting: Physical Medicine & Rehabilitation

## 2023-05-22 ENCOUNTER — Encounter: Payer: Self-pay | Admitting: Physical Medicine and Rehabilitation

## 2023-05-30 ENCOUNTER — Ambulatory Visit: Payer: 59 | Attending: Family Medicine | Admitting: Physical Therapy

## 2023-05-30 DIAGNOSIS — M25512 Pain in left shoulder: Secondary | ICD-10-CM | POA: Diagnosis present

## 2023-05-30 DIAGNOSIS — M25552 Pain in left hip: Secondary | ICD-10-CM | POA: Diagnosis present

## 2023-05-30 DIAGNOSIS — Z993 Dependence on wheelchair: Secondary | ICD-10-CM | POA: Insufficient documentation

## 2023-05-30 DIAGNOSIS — M25551 Pain in right hip: Secondary | ICD-10-CM | POA: Insufficient documentation

## 2023-05-30 DIAGNOSIS — M5459 Other low back pain: Secondary | ICD-10-CM | POA: Diagnosis present

## 2023-05-30 DIAGNOSIS — G8929 Other chronic pain: Secondary | ICD-10-CM | POA: Insufficient documentation

## 2023-05-30 DIAGNOSIS — M25511 Pain in right shoulder: Secondary | ICD-10-CM | POA: Diagnosis present

## 2023-05-30 DIAGNOSIS — M546 Pain in thoracic spine: Secondary | ICD-10-CM | POA: Diagnosis present

## 2023-05-30 DIAGNOSIS — M6281 Muscle weakness (generalized): Secondary | ICD-10-CM | POA: Diagnosis present

## 2023-05-30 DIAGNOSIS — G8220 Paraplegia, unspecified: Secondary | ICD-10-CM | POA: Diagnosis not present

## 2023-05-30 DIAGNOSIS — M7918 Myalgia, other site: Secondary | ICD-10-CM | POA: Insufficient documentation

## 2023-05-30 NOTE — Therapy (Signed)
OUTPATIENT PHYSICAL THERAPY NEURO EVALUATION   Patient Name: Devin Collins MRN: [id number] DOB:1980-10-10, 42 y.o., male Today's Date: 05/30/2023   PCP: Dorian Heckle, MD (per chart) REFERRING PROVIDER: Genice Rouge, MD  END OF SESSION:  PT End of Session - 05/30/23 1231     Visit Number 1    Number of Visits 7   with eval   Date for PT Re-Evaluation 07/25/23    Authorization Type [employer] Medicare    PT Start Time 1230    PT Stop Time 1310    PT Time Calculation (min) 40 min    Activity Tolerance Patient tolerated treatment well    Behavior During Therapy WFL for tasks assessed/performed             Past Medical History:  Diagnosis Date   Arthritis    Decubitus ulcer of sacral region, stage 4 (HCC) 10/03/2017   Post-traumatic paraplegia 2004   Scoliosis    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[facility])    Past Surgical History:  Procedure Laterality Date   COSMETIC SURGERY  10/05/2018   flap closure sacral wound   IVC FILTER INSERTION     posterior lumbar laminectomy and fusion  12/03/2018   SPINE SURGERY  11/09/2018   lumbar fusion    THORACIC FUSION     T10-11   THORACIC LAMINECTOMY  08/2005   Patient Active Problem List   Diagnosis Date Noted   Ulnar neuropathy at elbow, left 10/31/2021   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   Torticollis, acquired 07/30/2021   Post-traumatic paraplegia 06/05/2021   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   Hip pain, bilateral 01/05/2021   Vitamin D deficiency 01/05/2021   Myofascial pain 01/05/2021   Loss of balance 11/17/2020   Neurogenic bladder 11/17/2020   Wheelchair dependence 07/21/2020   Paraplegia (HCC) 05/22/2020   Spasticity 05/22/2020    ONSET DATE: 05/07/2023 (referral date)  REFERRING DIAG: G82.20 (ICD-10-CM) - Paraplegia (HCC) M79.18 (ICD-10-CM) - Myofascial pain Z99.3 (ICD-10-CM) - Wheelchair dependence  THERAPY DIAG:  Muscle weakness (generalized)  Rationale for Evaluation and Treatment:  Rehabilitation  SUBJECTIVE:                                                                                                                                                                                             SUBJECTIVE STATEMENT: Patient presents to therapy evaluation with complaints of whole body pain and "popping". Pt reports that this popping of his muscles goes from his abdomen down into his hips and his groin and even crosses over at his lower back region. Pt also reports pain at the base  of his sternum that travels around the sides of his chest into his back/spine region. Pt even has pain at times in the muscles in his forehead that travels to his temples. Pt reports that these symptoms have been going on for about 2 years and he is worried about losing his independence.  Pt is also concerned about his sitting balance and his UE strength. Previously he was able to hold up a gallon of milk, tea, etc in one hand but now he has to use both hands. Pt does have incomplete tears in both of his RTCs. Patient has no pain in his shoulders with OH motions.  Pt also reports he does have power assist for his manual wheelchair that he uses to travel longer distances.   Pt accompanied by: self  PERTINENT HISTORY: ***  PAIN:  Are you having pain? Yes: NPRS scale: 5/10 Pain location: everywhere Pain description: "popping" Aggravating factors: laying prone, any movement Relieving factors: N/A  Pt also states from an aggravation point of view pain is 12/10  PRECAUTIONS: Fall  RED FLAGS: None   WEIGHT BEARING RESTRICTIONS: No  FALLS: Has patient fallen in last 6 months? Yes. Number of falls ***  LIVING ENVIRONMENT: Lives with: lives with their family Lives in: House/apartment Stairs: {opstairs:27293} Has following equipment at home: {Assistive devices:23999} Home is accessible  PLOF: {PLOF:24004}  PATIENT GOALS: ***  OBJECTIVE:   DIAGNOSTIC FINDINGS:  ***  COGNITION: Overall cognitive status: {cognition:24006}   SENSATION: {sensation:27233}  COORDINATION: ***  EDEMA:  {edema:24020}  MUSCLE TONE: {LE tone:25568}  MUSCLE LENGTH: Hamstrings: Right *** deg; Left *** deg Thomas test: Right *** deg; Left *** deg  DTRs:  {DTR SITE:24025}  POSTURE: {posture:25561}  LOWER EXTREMITY ROM:     Passive  Right Eval Left Eval  Hip flexion [facility]  Hip extension    Hip abduction [facility]  Hip adduction    Hip internal rotation    Hip external rotation    Knee flexion [facility]  Knee extension [facility]  Ankle dorsiflexion [facility] WFL  Ankle plantarflexion WFL WFL  Ankle inversion [facility] WFL  Ankle eversion WFL WFL   (Blank rows = not tested)   Of note: patient does exhibit PROM of BLE WFL in all available planes, however when placed in Modified Thomas stretch position he has onset of B hip flexor spasticity/tightness preventing full ROM.  LOWER EXTREMITY MMT:    MMT Right Eval Left Eval  Hip flexion    Hip extension    Hip abduction    Hip adduction    Hip internal rotation    Hip external rotation    Knee flexion    Knee extension    Ankle dorsiflexion    Ankle plantarflexion    Ankle inversion    Ankle eversion    (Blank rows = not tested)   BLE WFL in supine but when in Modified Thomas position has onset of spasticity in hips Want to try standing frame but need to check bone density first  Not comfortable in prone, has onset of hip spasticity/tightness as well as all around back  Pain jumps from side to side  On several meds for spasticity L side of trunk is weaker  BED MOBILITY:  {Bed mobility:24027}  TRANSFERS: Assistive device utilized: {Assistive devices:23999}  Sit to stand: {Levels of assistance:24026} Stand to sit: {Levels of assistance:24026} Chair to chair: {Levels of assistance:24026} Floor: {Levels of assistance:24026}    FUNCTIONAL TESTS:  FIST: ***  TODAY'S TREATMENT:                                                                                                                               ***   Seated piriformis stretch  TherAct Trigger Point Dry-Needling  Treatment instructions: Expect mild to moderate muscle soreness. S/S of pneumothorax if dry needled over a lung field, and to seek immediate medical attention should they occur. Patient verbalized understanding of these instructions and education.  Patient Consent Given: Yes Education handout provided: No Muscles treated: L rhomboids and R lumbar multifidus Treatment response/outcome: deep ache/muscle cramp with muscle twitch detected    PATIENT EDUCATION: Education details: *** Person educated: {Person educated:25204} Education method: {Education Method:25205} Education comprehension: {Education Comprehension:25206}  HOME EXERCISE PROGRAM: ***  GOALS: Goals reviewed with patient? {yes/no:20286}  SHORT TERM GOALS: Target date: ***  *** Baseline: Goal status: INITIAL  2.  *** Baseline:  Goal status: INITIAL  3.  *** Baseline:  Goal status: INITIAL  4.  *** Baseline:  Goal status: INITIAL  5.  *** Baseline:  Goal status: INITIAL  6.  *** Baseline:  Goal status: INITIAL  LONG TERM GOALS: Target date: ***  *** Baseline:  Goal status: INITIAL  2.  *** Baseline:  Goal status: INITIAL  3.  *** Baseline:  Goal status: INITIAL  4.  *** Baseline:  Goal status: INITIAL  5.  *** Baseline:  Goal status: INITIAL  6.  *** Baseline:  Goal status: INITIAL  ASSESSMENT:  CLINICAL IMPRESSION: Patient is a *** year old *** referred to Neuro OPPT for***.   Pt's PMH is significant for: *** The following deficits were present during the exam: ***. Based on ***, pt is an incr risk for falls. Pt would benefit from skilled PT to address these impairments and functional limitations to maximize functional mobility independence   OBJECTIVE IMPAIRMENTS: {opptimpairments:25111}.    ACTIVITY LIMITATIONS: {activitylimitations:27494}  PARTICIPATION LIMITATIONS: {participationrestrictions:25113}  PERSONAL FACTORS: {Personal factors:25162} are also affecting patient's functional outcome.   REHAB POTENTIAL: {rehabpotential:25112}  CLINICAL DECISION MAKING: {clinical decision making:25114}  EVALUATION COMPLEXITY: {Evaluation complexity:25115}  PLAN:  PT FREQUENCY: 1x/week  PT DURATION: 6 weeks  PLANNED INTERVENTIONS: Therapeutic exercises, Therapeutic activity, Neuromuscular re-education, Balance training, Gait training, Patient/Family education, Self Care, Joint mobilization, Dry Needling, Electrical stimulation, Wheelchair mobility training, Spinal manipulation, Spinal mobilization, Cryotherapy, Moist heat, Taping, Manual therapy, and Re-evaluation  PLAN FOR NEXT SESSION: STOMPS, ***dexa?    Peter Congo, PT, DPT, CSRS  05/30/2023, 1:17 PM

## 2023-06-04 ENCOUNTER — Ambulatory Visit: Payer: 59 | Admitting: Physical Therapy

## 2023-06-04 DIAGNOSIS — G8929 Other chronic pain: Secondary | ICD-10-CM

## 2023-06-04 DIAGNOSIS — M5459 Other low back pain: Secondary | ICD-10-CM

## 2023-06-04 DIAGNOSIS — M546 Pain in thoracic spine: Secondary | ICD-10-CM

## 2023-06-04 DIAGNOSIS — M25551 Pain in right hip: Secondary | ICD-10-CM

## 2023-06-04 DIAGNOSIS — M6281 Muscle weakness (generalized): Secondary | ICD-10-CM

## 2023-06-04 NOTE — Therapy (Signed)
OUTPATIENT PHYSICAL THERAPY NEURO TREATMENT   Patient Name: Devin Collins MRN: [id number] DOB:[birth year], 42 y.o., male Today's Date: 06/04/2023   PCP: Dorian Heckle, MD (per chart) REFERRING PROVIDER: Genice Rouge, MD  END OF SESSION:  PT End of Session - 06/04/23 1148     Visit Number 2    Number of Visits 7   with eval   Date for PT Re-Evaluation 07/25/23    Authorization Type [employer] Medicare    PT Start Time 1145    PT Stop Time 1230    PT Time Calculation (min) 45 min    Activity Tolerance Patient tolerated treatment well    Behavior During Therapy WFL for tasks assessed/performed              Past Medical History:  Diagnosis Date   Arthritis    Decubitus ulcer of sacral region, stage 4 (HCC) 10/03/2017   Post-traumatic paraplegia 2004   Scoliosis    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[facility])    Past Surgical History:  Procedure Laterality Date   COSMETIC SURGERY  10/05/2018   flap closure sacral wound   IVC FILTER INSERTION     posterior lumbar laminectomy and fusion  12/03/2018   SPINE SURGERY  11/09/2018   lumbar fusion    THORACIC FUSION     T10-11   THORACIC LAMINECTOMY  08/2005   Patient Active Problem List   Diagnosis Date Noted   Ulnar neuropathy at elbow, left 10/31/2021   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   Torticollis, acquired 07/30/2021   Post-traumatic paraplegia 06/05/2021   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   Hip pain, bilateral 01/05/2021   Vitamin D deficiency 01/05/2021   Myofascial pain 01/05/2021   Loss of balance 11/17/2020   Neurogenic bladder 11/17/2020   Wheelchair dependence 07/21/2020   Paraplegia (HCC) 05/22/2020   Spasticity 05/22/2020    ONSET DATE: 05/07/2023 (referral date)  REFERRING DIAG: G82.20 (ICD-10-CM) - Paraplegia (HCC) M79.18 (ICD-10-CM) - Myofascial pain Z99.3 (ICD-10-CM) - Wheelchair dependence  THERAPY DIAG:  Muscle weakness (generalized)  Chronic pain of both shoulders  Pain in  thoracic spine  Pain of both hip joints  Other low back pain  Rationale for Evaluation and Treatment: Rehabilitation  SUBJECTIVE:                                                                                                                                                                                             SUBJECTIVE STATEMENT: Pt reports a few days ago he heard a popping in his lower back and ever since then when he does a wheelchair push-up his  hips twist to the left (R hip goes forwards, L hip goes back).  Pt reports his pain is getting to the point where it hurts to breath, sit, etc.  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     PAIN:  Are you having pain? Yes: NPRS scale: 5/10 Pain location: everywhere Pain description: "popping" Aggravating factors: laying prone, any movement Relieving factors: N/A  Pt also states from an aggravation point of view pain is 12/10  PRECAUTIONS: Fall  RED FLAGS: None   WEIGHT BEARING RESTRICTIONS: No  FALLS: Has patient fallen in last 6 months? Yes. Number of falls unknown, one witness fall by this therapist that needed total A to return to w/c from the ground  LIVING ENVIRONMENT: Lives with: lives with their family Lives in: House/apartment Home is accessible to patient  PLOF: Independent with basic ADLs, Independent with household mobility with device, Independent with community mobility with device, Independent with transfers, and Requires assistive device for independence  PATIENT GOALS: "to maintain my independence"  OBJECTIVE:   DIAGNOSTIC FINDINGS:  See chart for more extensive review.  MRI PELVIS W AND WO CONTRAST, 04/18/2023 5:59 PM  IMPRESSION: CONCLUSION: 1. Redemonstrated changes of chronic  osteomyelitis in the right ischium, with improved signal compared to prior study. No evidence of acute osteomyelitis. 2. Interval slight decrease in size of the right buttock mass as above, favored large adventitial bursitis. Interval increased fluid component in lateral aspect of the bursa.  COGNITION: Overall cognitive status: Within functional limits for tasks assessed   SENSATION: T5 paraplegic   POSTURE: rounded shoulders, forward head, increased thoracic kyphosis, posterior pelvic tilt, and flexed trunk   LOWER EXTREMITY ROM:     Passive  Right Eval Left Eval  Hip flexion [facility]  Hip extension    Hip abduction [facility]  Hip adduction    Hip internal rotation    Hip external rotation    Knee flexion [facility]  Knee extension [facility]  Ankle dorsiflexion [facility]  Ankle plantarflexion [facility] WFL  Ankle inversion [facility] WFL  Ankle eversion WFL WFL   (Blank rows = not tested)   Of note: patient does exhibit PROM of BLE WFL in all available planes, however when placed in Modified Thomas stretch position he has onset of B hip flexor spasticity/tightness preventing full ROM.  LOWER EXTREMITY MMT:    MMT Right Eval Left Eval  Hip flexion    Hip extension    Hip abduction    Hip adduction    Hip internal rotation    Hip external rotation    Knee flexion    Knee extension    Ankle dorsiflexion    Ankle plantarflexion    Ankle inversion    Ankle eversion    (Blank rows = not tested)   BLE WFL in supine but when in Modified Thomas position has onset of spasticity in hips Want to try standing frame but need to check bone density first  Not comfortable in prone, has onset of hip spasticity/tightness as well as all around back  Pain jumps from side to side  On several meds for spasticity L side of trunk is weaker  BED MOBILITY:  Mod I  TRANSFERS: Assistive device utilized: Wheelchair (manual)  Sit to stand:  unable (T5 para) Stand to sit:  unable (T5 para) Chair to  chair: Modified independence Floor: Total A    FUNCTIONAL TESTS:  FIST:  26/56   TODAY'S TREATMENT:                                                                                                                               TherEx Attempted to review seated STOMPS exercises with patient: Seated resisted scap squeezes with RTB, difficulty performing exercise due to poor core control as well as shoulder weakness Regressed to unresisted seated scap squeezes, pt exhibits decreased activation of L rhomboids as compared to his R rhomboids Seated resisted ER with RTB, difficulty performing exercise due to poor core control as well as shoulder weakness Seated PROM L and R shoulder ER, R shoulder noted to have decreased ROM Seated shoulder scaption x 10 reps B (has to perform one UE at at time due to poor sitting balance/core control)  Transitioned to supine position on mat table Supine R shoulder ER/IR x 5 reps of PROM with progression to 5 reps of AROM Pt noted to have increased R shoulder ER following stretching Supine B serratus punches x 10 reps B  Added appropriate exercises to HEP, see bolded below   TherAct Trigger Point Dry-Needling  Treatment instructions: Expect mild to moderate muscle soreness. S/S of pneumothorax if dry needled over a lung field, and to seek immediate medical attention should they occur. Patient verbalized understanding of these instructions and education.  Patient Consent Given: Yes Education handout provided: No (pt has received TPDN previously) Muscles treated: R suboccipitals Treatment response/outcome: deep ache/muscle cramp with muscle twitch detected; improved cervical ROM and head in midline vs slight tilt to R    PATIENT EDUCATION: Education details: initiated HEP, TPDN Person educated: Patient Education method: Explanation, Demonstration, Tactile cues, Verbal cues, and Handouts Education comprehension: verbalized understanding, returned  demonstration, and needs further education  HOME EXERCISE PROGRAM: Access Code: [email or web address] URL: [email or web address] Date: 06/04/2023 Prepared by: Peter Congo  Exercises - Supine Shoulder External Rotation Stretch  - 1 x daily - 7 x weekly - 3 sets - 10 reps - Seated Scapular Retraction  - 1 x daily - 7 x weekly - 3 sets - 10 reps - Supine Bilateral Shoulder Protraction  - 1 x daily - 7 x weekly - 3 sets - 10 reps - Seated Shoulder Scaption AROM  - 1 x daily - 7 x weekly - 3 sets - 10 reps  GOALS: Goals reviewed with patient? Yes  SHORT TERM GOALS: Target date: 06/24/2023  Pt will be independent with initial HEP for improved strength, balance, transfers and ability to complete ADLs independently. Baseline: Goal status: INITIAL   LONG TERM GOALS: Target date: 07/15/2023   Pt will be independent with final HEP for improved strength, balance, transfers and ability to complete ADLs independently. Baseline:  Goal status: INITIAL  2.  Patient will improve Function In Sitting  Test to 30/56 to indicate a clinically meaningful change in sensory, motor, proactive, reactive, and steady static seated balance needed to help maintain proper prolonged positioning while sitting in a wheelchair as well as for ability to perform functional activities.  Baseline: 26/56 (8/23) Goal status: INITIAL  3.  QuickDASH or SPADI to be assessed and LTG set Baseline:  Goal status: INITIAL   ASSESSMENT:  CLINICAL IMPRESSION: Emphasis of skilled PT session on initiating HEP to work on B shoulder stabilization and strengthening as well as performing TPDN to R suboccipitals. Pt found to have decreased R shoulder ER and decreased activation of his L rhomboids so modified STOMPS to work on current deficits. Pt with increased cervical ROM and decreased shoulder asymmetry noted following session. Pt continues to benefit from skilled therapy services to work towards LTGs. Continue  POC.    OBJECTIVE IMPAIRMENTS: decreased activity tolerance, decreased balance, decreased mobility, difficulty walking, decreased ROM, decreased strength, increased fascial restrictions, impaired perceived functional ability, increased muscle spasms, impaired sensation, impaired tone, impaired UE functional use, improper body mechanics, postural dysfunction, and pain.   ACTIVITY LIMITATIONS: carrying, lifting, bending, standing, squatting, stairs, and hygiene/grooming  PARTICIPATION LIMITATIONS: driving and community activity  PERSONAL FACTORS: Time since onset of injury/illness/exacerbation and 1-2 comorbidities:    T5 paraplegia, B torn RTCs, and cervical stenosis with posterior fusion.are also affecting patient's functional outcome.   REHAB POTENTIAL: Fair time since onset of SCI, torn RTCs  CLINICAL DECISION MAKING: Stable/uncomplicated  EVALUATION COMPLEXITY: High  PLAN:  PT FREQUENCY: 1x/week  PT DURATION: 6 weeks  PLANNED INTERVENTIONS: Therapeutic exercises, Therapeutic activity, Neuromuscular re-education, Balance training, Gait training, Patient/Family education, Self Care, Joint mobilization, Dry Needling, Electrical stimulation, Wheelchair mobility training, Spinal manipulation, Spinal mobilization, Cryotherapy, Moist heat, Taping, Manual therapy, and Re-evaluation  PLAN FOR NEXT SESSION: how is initial HEP? Progress towards STOMPS as able, ask Dr. Berline Chough about dexa?, QuickDASH or SPADI and set LTG, TPDN, piriformis stretch, SKTC, LTR, modified Thomas stretch, prone?    Peter Congo, PT, DPT, CSRS  06/04/2023, 12:35 PM

## 2023-06-11 ENCOUNTER — Ambulatory Visit: Payer: 59 | Admitting: Physical Therapy

## 2023-06-17 ENCOUNTER — Ambulatory Visit: Payer: 59 | Attending: Family Medicine | Admitting: Physical Therapy

## 2023-06-17 DIAGNOSIS — M6281 Muscle weakness (generalized): Secondary | ICD-10-CM | POA: Insufficient documentation

## 2023-06-17 DIAGNOSIS — M25551 Pain in right hip: Secondary | ICD-10-CM | POA: Insufficient documentation

## 2023-06-17 DIAGNOSIS — M25511 Pain in right shoulder: Secondary | ICD-10-CM | POA: Insufficient documentation

## 2023-06-17 DIAGNOSIS — M546 Pain in thoracic spine: Secondary | ICD-10-CM | POA: Diagnosis present

## 2023-06-17 DIAGNOSIS — G8929 Other chronic pain: Secondary | ICD-10-CM | POA: Insufficient documentation

## 2023-06-17 DIAGNOSIS — M5459 Other low back pain: Secondary | ICD-10-CM | POA: Insufficient documentation

## 2023-06-17 DIAGNOSIS — M25512 Pain in left shoulder: Secondary | ICD-10-CM | POA: Insufficient documentation

## 2023-06-17 DIAGNOSIS — M25552 Pain in left hip: Secondary | ICD-10-CM | POA: Insufficient documentation

## 2023-06-17 NOTE — Therapy (Signed)
OUTPATIENT PHYSICAL THERAPY NEURO TREATMENT   Patient Name: Devin Collins MRN: [id number] DOB:April 19, 1981, 42 y.o., male Today's Date: 06/17/2023   PCP: Dorian Heckle, MD (per chart) REFERRING PROVIDER: Genice Rouge, MD  END OF SESSION:  PT End of Session - 06/17/23 1105     Visit Number 3    Number of Visits 7   with eval   Date for PT Re-Evaluation 07/25/23    Authorization Type [employer]    PT Start Time 1105    PT Stop Time 1150    PT Time Calculation (min) 45 min    Activity Tolerance Patient tolerated treatment well    Behavior During Therapy WFL for tasks assessed/performed               Past Medical History:  Diagnosis Date   Arthritis    Decubitus ulcer of sacral region, stage 4 (HCC) 10/03/2017   Post-traumatic paraplegia 2004   Scoliosis    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[facility])    Past Surgical History:  Procedure Laterality Date   COSMETIC SURGERY  10/05/2018   flap closure sacral wound   IVC FILTER INSERTION     posterior lumbar laminectomy and fusion  12/03/2018   SPINE SURGERY  11/09/2018   lumbar fusion    THORACIC FUSION     T10-11   THORACIC LAMINECTOMY  08/2005   Patient Active Problem List   Diagnosis Date Noted   Ulnar neuropathy at elbow, left 10/31/2021   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   Torticollis, acquired 07/30/2021   Post-traumatic paraplegia 06/05/2021   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   Hip pain, bilateral 01/05/2021   Vitamin D deficiency 01/05/2021   Myofascial pain 01/05/2021   Loss of balance 11/17/2020   Neurogenic bladder 11/17/2020   Wheelchair dependence 07/21/2020   Paraplegia (HCC) 05/22/2020   Spasticity 05/22/2020    ONSET DATE: 05/07/2023 (referral date)  REFERRING DIAG: G82.20 (ICD-10-CM) - Paraplegia (HCC) M79.18 (ICD-10-CM) - Myofascial pain Z99.3 (ICD-10-CM) - Wheelchair dependence  THERAPY DIAG:  Muscle weakness (generalized)  Chronic pain of both shoulders  Pain in  thoracic spine  Pain of both hip joints  Other low back pain  Rationale for Evaluation and Treatment: Rehabilitation  SUBJECTIVE:                                                                                                                                                                                             SUBJECTIVE STATEMENT: Pt reports he is feeling a little better, has been doing his HEP and feels like that is going well. Pt reports he has  a tight/painful spot between his L shoulder blade and his spine that causes muscle tightness all the way down the R side of his trunk and into his abdomen.  Pt sees Dr. Berline Chough tomorrow for trigger point injections, sees his shoulder doctor 9/23.   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     PAIN:  Are you having pain? Yes: NPRS scale: 5/10 Pain location: everywhere Pain description: "popping" Aggravating factors: laying prone, any movement Relieving factors: N/A  Pt also states from an aggravation point of view pain is 12/10  PRECAUTIONS: Fall  RED FLAGS: None   WEIGHT BEARING RESTRICTIONS: No  FALLS: Has patient fallen in last 6 months? Yes. Number of falls unknown, one witnessed fall by this therapist that needed total A to return to w/c from the ground  LIVING ENVIRONMENT: Lives with: lives with their family Lives in: House/apartment Home is accessible to patient  PLOF: Independent with basic ADLs, Independent with household mobility with device, Independent with community mobility with device, Independent with transfers, and Requires assistive device for independence  PATIENT GOALS: "to maintain my independence"  OBJECTIVE:   DIAGNOSTIC FINDINGS:  See chart for more extensive review.  MRI PELVIS  W AND WO CONTRAST, 04/18/2023 5:59 PM  IMPRESSION: CONCLUSION: 1. 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 No evidence of acute osteomyelitis. 2. Interval slight decrease in size of the right buttock mass as above, favored large adventitial bursitis. Interval increased fluid component in lateral aspect of the bursa.  COGNITION: Overall cognitive status: Within functional limits for tasks assessed   SENSATION: T5 paraplegic   POSTURE: rounded shoulders, forward head, increased thoracic kyphosis, posterior pelvic tilt, and flexed trunk   LOWER EXTREMITY ROM:     Passive  Right Eval Left Eval  Hip flexion [facility]  Hip extension    Hip abduction [facility]  Hip adduction    Hip internal rotation    Hip external rotation    Knee flexion [facility]  Knee extension [facility]  Ankle dorsiflexion [facility]  Ankle plantarflexion [facility] WFL  Ankle inversion [facility] WFL  Ankle eversion WFL WFL   (Blank rows = not tested)   Of note: patient does exhibit PROM of BLE WFL in all available planes, however when placed in Modified Thomas stretch position he has onset of B hip flexor spasticity/tightness preventing full ROM.  LOWER EXTREMITY MMT:    MMT Right Eval Left Eval  Hip flexion    Hip extension    Hip abduction    Hip adduction    Hip internal rotation    Hip external rotation    Knee flexion    Knee extension    Ankle dorsiflexion    Ankle plantarflexion    Ankle inversion    Ankle eversion    (Blank rows = not tested)   BLE WFL in supine but when in Modified Thomas position has onset of spasticity in hips Want to try standing frame but need to check bone density first  Not comfortable in prone, has onset of hip spasticity/tightness as well as all around back  Pain jumps from side to side  On several meds for spasticity L side of trunk is weaker  BED  MOBILITY:  Mod I  TRANSFERS: Assistive device  utilized: Wheelchair (manual)  Sit to stand:  unable (T5 para) Stand to sit:  unable (T5 para) Chair to chair: Modified independence Floor: Total A    FUNCTIONAL TESTS:  FIST:  26/56   TODAY'S TREATMENT:                                                                                                                               TherEx Supine thoracic mobilization over towel roll x 15 reps Sidelying PROM hip flexor stretch 3 x 30 sec each B Prone hip flexor stretch x 5 min, encouraged pt to do this for 10 min at home and progress to placing pillow under thighs to increase stretch With PROM of LE into knee flexion pt exhibits rotation of hips up off of mat table due to hip flexor tightness? Supine modified Thomas stretch with overpressure 3 x 30 sec each B  Encouraged pt to have his significant other assist with overpressure in modified Thomas position to stretch out hip flexors, added above exercises to HEP (see bolded below)   TherAct Trigger Point Dry-Needling  Treatment instructions: Expect mild to moderate muscle soreness. S/S of pneumothorax if dry needled over a lung field, and to seek immediate medical attention should they occur. Patient verbalized understanding of these instructions and education.  Patient Consent Given: Yes Education handout provided: No (pt has received TPDN previously) Muscles treated: R and L splenius, R UT Treatment response/outcome: deep ache/muscle cramp with muscle twitch detected   PATIENT EDUCATION: Education details: continue HEP and added to HEP Person educated: Patient Education method: Programmer, multimedia, Demonstration, Actor cues, Verbal cues, and Handouts Education comprehension: verbalized understanding, returned demonstration, and needs further education  HOME EXERCISE PROGRAM: Access Code: [email or web address] URL: [email or web address] Date: 06/04/2023 Prepared by: Peter Congo  Exercises - Supine Shoulder External Rotation  Stretch  - 1 x daily - 7 x weekly - 3 sets - 10 reps - Seated Scapular Retraction  - 1 x daily - 7 x weekly - 3 sets - 10 reps - Supine Bilateral Shoulder Protraction  - 1 x daily - 7 x weekly - 3 sets - 10 reps - Seated Shoulder Scaption AROM  - 1 x daily - 7 x weekly - 3 sets - 10 reps - Supine Thoracic Mobilization Towel Roll Vertical with Arm Stretch  - 1 x daily - 7 x weekly - 3 sets - 10 reps - Prone Hip Extension with Bent Knee - One Pillow  - 1 x daily - 7 x weekly - 1 sets - 1 reps - 10 min hold - Modified Thomas Stretch  - 1 x daily - 7 x weekly - 1 sets - 5 reps - 30 sec hold   GOALS: Goals reviewed with patient? Yes  SHORT TERM GOALS: Target date: 06/24/2023  Pt will be independent with initial HEP for improved strength, balance, transfers and ability to complete ADLs  independently. Baseline: Goal status: INITIAL   LONG TERM GOALS: Target date: 07/15/2023   Pt will be independent with final HEP for improved strength, balance, transfers and ability to complete ADLs independently. Baseline:  Goal status: INITIAL  2.  Patient will improve Function In Sitting Test to 30/56 to indicate a clinically meaningful change in sensory, motor, proactive, reactive, and steady static seated balance needed to help maintain proper prolonged positioning while sitting in a wheelchair as well as for ability to perform functional activities.  Baseline: 26/56 (8/23) Goal status: INITIAL  3.  QuickDASH or SPADI to be assessed and LTG set Baseline:  Goal status: INITIAL   ASSESSMENT:  CLINICAL IMPRESSION: Emphasis of skilled PT session on continuing to work on addressing global muscle tightness in UB and LB, stretching that pt can perform independently or with assist from family, and performing TPDN to address ongoing pain and tightness. Pt with improved tolerance for prone positioning this date and can benefit from stretching in this position daily to work on decreasing hip flexor tightness.  Pt continues to benefit from skilled therapy services to work towards decreasing muscle tightness, improving ROM, and improving UB strength/stability in order for him to maintain independence. Continue POC.    OBJECTIVE IMPAIRMENTS: decreased activity tolerance, decreased balance, decreased mobility, difficulty walking, decreased ROM, decreased strength, increased fascial restrictions, impaired perceived functional ability, increased muscle spasms, impaired sensation, impaired tone, impaired UE functional use, improper body mechanics, postural dysfunction, and pain.   ACTIVITY LIMITATIONS: carrying, lifting, bending, standing, squatting, stairs, and hygiene/grooming  PARTICIPATION LIMITATIONS: driving and community activity  PERSONAL FACTORS: Time since onset of injury/illness/exacerbation and 1-2 comorbidities:    T5 paraplegia, B torn RTCs, and cervical stenosis with posterior fusion.are also affecting patient's functional outcome.   REHAB POTENTIAL: Fair time since onset of SCI, torn RTCs  CLINICAL DECISION MAKING: Stable/uncomplicated  EVALUATION COMPLEXITY: High  PLAN:  PT FREQUENCY: 1x/week  PT DURATION: 6 weeks  PLANNED INTERVENTIONS: Therapeutic exercises, Therapeutic activity, Neuromuscular re-education, Balance training, Gait training, Patient/Family education, Self Care, Joint mobilization, Dry Needling, Electrical stimulation, Wheelchair mobility training, Spinal manipulation, Spinal mobilization, Cryotherapy, Moist heat, Taping, Manual therapy, and Re-evaluation  PLAN FOR NEXT SESSION: how is HEP? Progress towards STOMPS as able, ask Dr. Berline Chough about dexa?, QuickDASH or SPADI and set LTG, TPDN, piriformis stretch, SKTC, LTR, prone press-ups? IYT    Peter Congo, PT, DPT, CSRS  06/17/2023, 11:52 AM

## 2023-06-18 ENCOUNTER — Encounter: Payer: Self-pay | Admitting: Physical Medicine and Rehabilitation

## 2023-06-18 ENCOUNTER — Encounter: Payer: 59 | Attending: Physical Medicine and Rehabilitation | Admitting: Physical Medicine and Rehabilitation

## 2023-06-18 VITALS — BP 107/79 | HR 100 | Ht 72.0 in | Wt 158.8 lb

## 2023-06-18 DIAGNOSIS — M7918 Myalgia, other site: Secondary | ICD-10-CM | POA: Diagnosis not present

## 2023-06-18 DIAGNOSIS — G8221 Paraplegia, complete: Secondary | ICD-10-CM | POA: Diagnosis not present

## 2023-06-18 DIAGNOSIS — M818 Other osteoporosis without current pathological fracture: Secondary | ICD-10-CM | POA: Insufficient documentation

## 2023-06-18 DIAGNOSIS — Z993 Dependence on wheelchair: Secondary | ICD-10-CM | POA: Insufficient documentation

## 2023-06-18 DIAGNOSIS — R252 Cramp and spasm: Secondary | ICD-10-CM | POA: Diagnosis present

## 2023-06-18 DIAGNOSIS — N319 Neuromuscular dysfunction of bladder, unspecified: Secondary | ICD-10-CM | POA: Insufficient documentation

## 2023-06-18 MED ORDER — LIDOCAINE HCL 1 % IJ SOLN
9.0000 mL | Freq: Once | INTRAMUSCULAR | Status: AC
Start: 2023-06-18 — End: 2023-06-18
  Administered 2023-06-18: 9 mL

## 2023-06-18 NOTE — Progress Notes (Signed)
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  Also has unusual sensations/spasms-. Here for f/u on SCI. With newly dx'd Anterocollis greater than torticollis-  L 4th digit DIP tendon rupture B/L carpal tunnel syndrome L ulnar compressive neuropathy. Got approved for power assist w/c- will get in next month Here for fu on paraplegia and associated pain issues and trigger point injections.     Plan: B/L foot xrays at [address]- go when have a chance to see if can get in standing frame.   2. We know he has osteoporosis- due to SCI 20 years ago, but foot xrays will tell us if can stand in standing frame successfully.   3. Con't Baclofen, Valium- doesn't need refills.   4. Patient here for trigger point injections for myofascial pain  Consent done and on chart.  Cleaned areas with alcohol and injected using a 27 gauge 1.5 inch needle  Injected 9cc- none wasted Using 1% Lidocaine with no EPI  Upper traps B/L  Levators- b/L  Posterior scalenes Middle scalenes B/L x2 Splenius Capitus B/L  Pectoralis Major- B/L  Rhomboids B/L x3 Infraspinatus B/L Teres Major/minor B/L  Thoracic paraspinals B/L x2 Lumbar paraspinals B/L x2 Other injections- B/L hands, forearms, biceps, triceps   Patient's level of pain prior was 8/10 Current level of pain after injections is- down to 4-5/10  There was no bleeding or complications.  Patient was advised to drink a lot of water on day after injections to flush system Will have increased soreness for 12-48 hours after injections.  Can use Lidocaine patches the day AFTER injections Can use theracane on day of injections in places didn't inject Can use heating pad 4-6 hours AFTER injections  5. Urologist writes for condom cath- suggest [employer]- since  having trouble with company getting caths and condom caths  6. F/U  in 1 month for trigger point injections.    I spent a total of  35  minutes on total care today- >50% coordination of care- due to  10 minutes on injections- rest talking about osteoporosis and possible foot fractures.

## 2023-06-18 NOTE — Patient Instructions (Signed)
Plan: B/L foot xrays at [address]- go when have a chance to see if can get in standing frame.   2. We know he has osteoporosis- due to SCI 20 years ago, but foot xrays will tell us if can stand in standing frame successfully.   3. Con't Baclofen, Valium- doesn't need refills.   4. Patient here for trigger point injections for myofascial pain  Consent done and on chart.  Cleaned areas with alcohol and injected using a 27 gauge 1.5 inch needle  Injected 9cc- none wasted Using 1% Lidocaine with no EPI  Upper traps B/L  Levators- b/L  Posterior scalenes Middle scalenes B/L x2 Splenius Capitus B/L  Pectoralis Major- B/L  Rhomboids B/L x3 Infraspinatus B/L Teres Major/minor B/L  Thoracic paraspinals B/L x2 Lumbar paraspinals B/L x2 Other injections- B/L hands, forearms, biceps, triceps   Patient's level of pain prior was 8/10 Current level of pain after injections is- down to 4-5/10  There was no bleeding or complications.  Patient was advised to drink a lot of water on day after injections to flush system Will have increased soreness for 12-48 hours after injections.  Can use Lidocaine patches the day AFTER injections Can use theracane on day of injections in places didn't inject Can use heating pad 4-6 hours AFTER injections  5. Urologist writes for condom cath- suggest [employer]- since having trouble with company getting caths and condom caths  6. F/U  in 1 month for trigger point injections.

## 2023-06-18 NOTE — Addendum Note (Signed)
Addended by: Silas Sacramento T on: 06/18/2023 11:27 AM   Modules accepted: Orders

## 2023-06-24 ENCOUNTER — Ambulatory Visit: Payer: 59 | Admitting: Physical Therapy

## 2023-06-24 ENCOUNTER — Ambulatory Visit
Admission: RE | Admit: 2023-06-24 | Discharge: 2023-06-24 | Disposition: A | Discharge status: Home / Self Care | Payer: 59 | Source: Ambulatory Visit | Attending: Physical Medicine and Rehabilitation | Admitting: Physical Medicine and Rehabilitation

## 2023-06-24 DIAGNOSIS — M818 Other osteoporosis without current pathological fracture: Secondary | ICD-10-CM

## 2023-06-24 DIAGNOSIS — M25551 Pain in right hip: Secondary | ICD-10-CM

## 2023-06-24 DIAGNOSIS — M6281 Muscle weakness (generalized): Secondary | ICD-10-CM | POA: Diagnosis not present

## 2023-06-24 DIAGNOSIS — G8221 Paraplegia, complete: Secondary | ICD-10-CM

## 2023-06-24 DIAGNOSIS — M5459 Other low back pain: Secondary | ICD-10-CM

## 2023-06-24 DIAGNOSIS — G8929 Other chronic pain: Secondary | ICD-10-CM

## 2023-06-24 DIAGNOSIS — R252 Cramp and spasm: Secondary | ICD-10-CM

## 2023-06-24 DIAGNOSIS — M546 Pain in thoracic spine: Secondary | ICD-10-CM

## 2023-06-24 NOTE — Therapy (Signed)
OUTPATIENT PHYSICAL THERAPY NEURO TREATMENT   Patient Name: Devin Collins MRN: [id number] DOB:07/21/1981, 42 y.o., male Today's Date: 06/24/2023   PCP: Dorian Heckle, MD (per chart) REFERRING PROVIDER: Genice Rouge, MD  END OF SESSION:  PT End of Session - 06/24/23 1020     Visit Number 4    Number of Visits 7   with eval   Date for PT Re-Evaluation 07/25/23    Authorization Type [employer] Medicare    PT Start Time 1015    PT Stop Time 1105    PT Time Calculation (min) 50 min    Activity Tolerance Patient tolerated treatment well    Behavior During Therapy WFL for tasks assessed/performed                Past Medical History:  Diagnosis Date   Arthritis    Decubitus ulcer of sacral region, stage 4 (HCC) 10/03/2017   Post-traumatic paraplegia 2004   Scoliosis    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[facility])    Past Surgical History:  Procedure Laterality Date   COSMETIC SURGERY  10/05/2018   flap closure sacral wound   IVC FILTER INSERTION     posterior lumbar laminectomy and fusion  12/03/2018   SPINE SURGERY  11/09/2018   lumbar fusion    THORACIC FUSION     T10-11   THORACIC LAMINECTOMY  08/2005   Patient Active Problem List   Diagnosis Date Noted   Ulnar neuropathy at elbow, left 10/31/2021   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   Torticollis, acquired 07/30/2021   Post-traumatic paraplegia 06/05/2021   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   Hip pain, bilateral 01/05/2021   Vitamin D deficiency 01/05/2021   Myofascial pain 01/05/2021   Loss of balance 11/17/2020   Neurogenic bladder 11/17/2020   Wheelchair dependence 07/21/2020   Paraplegia (HCC) 05/22/2020   Spasticity 05/22/2020    ONSET DATE: 05/07/2023 (referral date)  REFERRING DIAG: G82.20 (ICD-10-CM) - Paraplegia (HCC) M79.18 (ICD-10-CM) - Myofascial pain Z99.3 (ICD-10-CM) - Wheelchair dependence  THERAPY DIAG:  Muscle weakness (generalized)  Chronic pain of both shoulders  Pain in  thoracic spine  Pain of both hip joints  Other low back pain  Rationale for Evaluation and Treatment: Rehabilitation  SUBJECTIVE:                                                                                                                                                                                             SUBJECTIVE STATEMENT: Pt with tightening of neck muscles while trying to swallow steak over the weekend, worried about choking, hasn't had any more issues with  these muscles since then. Pt also with tightening of his chest and feels like it is hard to breathe at times.  Pt states he is worried about his syrinx getting bigger or that his injury is becoming cervical level. Pt states that he feels like his grip strength is still intact but has muscles tighten up around his chest when he reaches for something.  Pt felt relief of pain from trigger points after visit with Dr. Berline Chough last week but only lasted about 2 days.  Pt has been working on HEP at home, doing prone hip stretches and having [name] help with sidelying stretch.   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     PAIN:  Are you having pain? Yes: NPRS scale: 5/10 Pain location: everywhere Pain description: "popping" Aggravating factors: laying prone, any movement Relieving factors: N/A  Pt also states from an aggravation point of view pain is 12/10  PRECAUTIONS: Fall  RED FLAGS: None   WEIGHT BEARING RESTRICTIONS: No  FALLS: Has patient fallen in last 6 months? Yes. Number of falls unknown, one witnessed fall by this therapist that needed total A to return to w/c from the ground  LIVING ENVIRONMENT: Lives with: lives with their family Lives in: House/apartment Home is accessible to  patient  PLOF: Independent with basic ADLs, Independent with household mobility with device, Independent with community mobility with device, Independent with transfers, and Requires assistive device for independence  PATIENT GOALS: "to maintain my independence"  OBJECTIVE:   DIAGNOSTIC FINDINGS:  See chart for more extensive review.  MRI PELVIS W AND WO CONTRAST, 04/18/2023 5:59 PM  IMPRESSION: CONCLUSION: 1. 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 No evidence of acute osteomyelitis. 2. Interval slight decrease in size of the right buttock mass as above, favored large adventitial bursitis. Interval increased fluid component in lateral aspect of the bursa.  COGNITION: Overall cognitive status: Within functional limits for tasks assessed   SENSATION: T5 paraplegic   POSTURE: rounded shoulders, forward head, increased thoracic kyphosis, posterior pelvic tilt, and flexed trunk   LOWER EXTREMITY ROM:     Passive  Right Eval Left Eval  Hip flexion [facility]  Hip extension    Hip abduction [facility]  Hip adduction    Hip internal rotation    Hip external rotation    Knee flexion [facility]  Knee extension [facility]  Ankle dorsiflexion [facility]  Ankle plantarflexion [facility] WFL  Ankle inversion [facility] WFL  Ankle eversion WFL WFL   (Blank rows = not tested)   Of note: patient does exhibit PROM of BLE WFL in all available planes, however when placed in Modified Thomas stretch position he has onset of B hip flexor spasticity/tightness preventing full ROM.  LOWER EXTREMITY MMT:    MMT Right Eval Left Eval  Hip flexion    Hip extension    Hip abduction    Hip adduction    Hip internal rotation    Hip external rotation    Knee flexion    Knee extension    Ankle dorsiflexion    Ankle plantarflexion    Ankle inversion    Ankle eversion    (Blank rows = not tested)   BLE [facility]  in supine but when in Modified  [place] position has onset of spasticity in hips Want to try standing frame but need to check bone density first  Not comfortable in prone, has onset of hip spasticity/tightness as well as all around back  Pain jumps from side to side  On several meds for spasticity L side of trunk is weaker  BED MOBILITY:  Mod I  TRANSFERS: Assistive device utilized: Wheelchair (manual)  Sit to stand:  unable (T5 para) Stand to sit:  unable (T5 para) Chair to chair: Modified independence Floor: Total A    FUNCTIONAL TESTS:  FIST:  26/56   TODAY'S TREATMENT:                                                                                                                               TherEx Attempted to perform prone shoulder/periscapular strengthening: Prone IYT with RUE Unable to do I's due to shoulder flexion weakness and weakness/pain in R biecps Difficulty performing Y's against gravity in prone position but increased movement as compared to I's T's x 10 reps with no pain and no difficulty, does feel pulling in pecs Pt noted to have tenderness to pressure in R biceps especially at muscle insertion, see his ortho (?) doc 9/26 to look at R shoulder Prone IYT with LUE Difficulty with I's and Y's due to shoulder weakness T's x 10 reps with weakness and stretching noted in L lats   TherAct Trigger Point Dry-Needling  Treatment instructions: Expect mild to moderate muscle soreness. S/S of pneumothorax if dry needled over a lung field, and to seek immediate medical attention should they occur. Patient verbalized understanding of these instructions and education.  Patient Consent Given: Yes Education handout provided: No (pt has received TPDN previously) Muscles treated: R pec major, L lats Treatment response/outcome: deep ache/muscle cramp with muscle twitch detected; relief of pain   PATIENT EDUCATION: Education details: continue HEP Person educated: Patient Education method:  Explanation, Demonstration, Tactile cues, and Verbal cues Education comprehension: verbalized understanding, returned demonstration, and needs further education  HOME EXERCISE PROGRAM: Access Code: [email or web address] URL: [email or web address] Date: 06/04/2023 Prepared by: Peter Congo  Exercises - Supine Shoulder External Rotation Stretch  - 1 x daily - 7 x weekly - 3 sets - 10 reps - Seated Scapular Retraction  - 1 x daily - 7 x weekly - 3 sets - 10 reps - Supine Bilateral Shoulder Protraction  - 1 x daily - 7 x weekly - 3 sets - 10 reps - Seated Shoulder Scaption AROM  - 1 x daily - 7 x weekly - 3 sets - 10 reps - Supine Thoracic Mobilization Towel Roll Vertical with Arm Stretch  - 1 x daily - 7 x weekly - 3 sets - 10 reps - Prone Hip Extension with Bent Knee - One Pillow  - 1 x daily - 7 x weekly - 1 sets - 1 reps - 10 min hold -  Modified Thomas Stretch  - 1 x daily - 7 x weekly - 1 sets - 5 reps - 30 sec hold   GOALS: Goals reviewed with patient? Yes  SHORT TERM GOALS: Target date: 06/24/2023  Pt will be independent with initial HEP for improved strength, balance, transfers and ability to complete ADLs independently. Baseline: Goal status: MET   LONG TERM GOALS: Target date: 07/15/2023   Pt will be independent with final HEP for improved strength, balance, transfers and ability to complete ADLs independently. Baseline:  Goal status: INITIAL  2.  Patient will improve Function In Sitting Test to 30/56 to indicate a clinically meaningful change in sensory, motor, proactive, reactive, and steady static seated balance needed to help maintain proper prolonged positioning while sitting in a wheelchair as well as for ability to perform functional activities.  Baseline: 26/56 (8/23) Goal status: INITIAL  3.  Pt to improve score on the SPADI to 20% disability to demonstrate decreased disability level. Baseline: 30% (9/17) Goal status: INITIAL   ASSESSMENT:  CLINICAL  IMPRESSION: Emphasis of skilled PT session on attempting to perform B shoulder and periscapular strengthening in prone position, pt with difficulty performing shoulder flexion and scaption against gravity, able to perform abduction but does have some pain and weakness noted in these muscles as well. Pt noted to have trigger points in his R pecs and L lats, benefits from TPDN to muscle groups this date. Pt has met 1/1 STG due to being independent with his initial HEP. Also assessed SPADI this session, pt scores 30% disability/pain based on his score today. Pt continues to benefit from skilled therapy services to work on further strengthening of periscapular region as able in order to increase his independence and ability to perform ADLs independently. Continue POC.    OBJECTIVE IMPAIRMENTS: decreased activity tolerance, decreased balance, decreased mobility, difficulty walking, decreased ROM, decreased strength, increased fascial restrictions, impaired perceived functional ability, increased muscle spasms, impaired sensation, impaired tone, impaired UE functional use, improper body mechanics, postural dysfunction, and pain.   ACTIVITY LIMITATIONS: carrying, lifting, bending, standing, squatting, stairs, and hygiene/grooming  PARTICIPATION LIMITATIONS: driving and community activity  PERSONAL FACTORS: Time since onset of injury/illness/exacerbation and 1-2 comorbidities:    T5 paraplegia, B torn RTCs, and cervical stenosis with posterior fusion.are also affecting patient's functional outcome.   REHAB POTENTIAL: Fair time since onset of SCI, torn RTCs  CLINICAL DECISION MAKING: Stable/uncomplicated  EVALUATION COMPLEXITY: High  PLAN:  PT FREQUENCY: 1x/week  PT DURATION: 6 weeks  PLANNED INTERVENTIONS: Therapeutic exercises, Therapeutic activity, Neuromuscular re-education, Balance training, Gait training, Patient/Family education, Self Care, Joint mobilization, Dry Needling, Electrical  stimulation, Wheelchair mobility training, Spinal manipulation, Spinal mobilization, Cryotherapy, Moist heat, Taping, Manual therapy, and Re-evaluation  PLAN FOR NEXT SESSION: how is HEP? Progress towards STOMPS as able,  TPDN, piriformis stretch, SKTC, LTR, prone press-ups? IYT in sitting or prone? Biceps tear or tendonitis?, results of xrays?    Peter Congo, PT, DPT, CSRS  06/24/2023, 11:06 AM

## 2023-07-01 ENCOUNTER — Ambulatory Visit: Payer: 59 | Admitting: Physical Therapy

## 2023-07-01 VITALS — BP 142/73 | HR 87

## 2023-07-01 DIAGNOSIS — M6281 Muscle weakness (generalized): Secondary | ICD-10-CM

## 2023-07-01 DIAGNOSIS — G8929 Other chronic pain: Secondary | ICD-10-CM

## 2023-07-01 DIAGNOSIS — M25552 Pain in left hip: Secondary | ICD-10-CM

## 2023-07-01 DIAGNOSIS — M546 Pain in thoracic spine: Secondary | ICD-10-CM

## 2023-07-01 DIAGNOSIS — M5459 Other low back pain: Secondary | ICD-10-CM

## 2023-07-01 NOTE — Therapy (Signed)
OUTPATIENT PHYSICAL THERAPY NEURO TREATMENT   Patient Name: Owenn Laffin MRN: [id number] DOB:08-Jan-1981, 42 y.o., male Today's Date: 07/01/2023   PCP: Dorian Heckle, MD (per chart) REFERRING PROVIDER: Genice Rouge, MD  END OF SESSION:  PT End of Session - 07/01/23 1024     Visit Number 5    Number of Visits 7   with eval   Date for PT Re-Evaluation 07/25/23    Authorization Type [employer] Medicare    PT Start Time 1020    PT Stop Time 1100    PT Time Calculation (min) 40 min    Activity Tolerance Patient tolerated treatment well    Behavior During Therapy WFL for tasks assessed/performed                 Past Medical History:  Diagnosis Date   Arthritis    Decubitus ulcer of sacral region, stage 4 (HCC) 10/03/2017   Post-traumatic paraplegia 2004   Scoliosis    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(HCC)    Past Surgical History:  Procedure Laterality Date   COSMETIC SURGERY  10/05/2018   flap closure sacral wound   IVC FILTER INSERTION     posterior lumbar laminectomy and fusion  12/03/2018   SPINE SURGERY  11/09/2018   lumbar fusion    THORACIC FUSION     T10-11   THORACIC LAMINECTOMY  08/2005   Patient Active Problem List   Diagnosis Date Noted   Ulnar neuropathy at elbow, left 10/31/2021   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   Torticollis, acquired 07/30/2021   Post-traumatic paraplegia 06/05/2021   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   Hip pain, bilateral 01/05/2021   Vitamin D deficiency 01/05/2021   Myofascial pain 01/05/2021   Loss of balance 11/17/2020   Neurogenic bladder 11/17/2020   Wheelchair dependence 07/21/2020   Paraplegia (HCC) 05/22/2020   Spasticity 05/22/2020    ONSET DATE: 05/07/2023 (referral date)  REFERRING DIAG: G82.20 (ICD-10-CM) - Paraplegia (HCC) M79.18 (ICD-10-CM) - Myofascial pain Z99.3 (ICD-10-CM) - Wheelchair dependence  THERAPY DIAG:  Muscle weakness (generalized)  Chronic pain of both shoulders  Pain in  thoracic spine  Pain of both hip joints  Other low back pain  Rationale for Evaluation and Treatment: Rehabilitation  SUBJECTIVE:                                                                                                                                                                                             SUBJECTIVE STATEMENT: Pt reports that he is not feeling well today, has pain in the R side of his head like tight bands wrapped  around the side and top of his head. Pt had a UTI a few weeks ago and finishing up meds for that. Pt had his xrays performed last week, still waiting on results.   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     PAIN:  Are you having pain? Yes: NPRS scale: 5/10 Pain location: everywhere Pain description: "popping" Aggravating factors: laying prone, any movement Relieving factors: N/A  Pt also states from an aggravation point of view pain is 12/10  PRECAUTIONS: Fall  RED FLAGS: None   WEIGHT BEARING RESTRICTIONS: No  FALLS: Has patient fallen in last 6 months? Yes. Number of falls unknown, one witnessed fall by this therapist that needed total A to return to w/c from the ground  LIVING ENVIRONMENT: Lives with: lives with their family Lives in: House/apartment Home is accessible to patient  PLOF: Independent with basic ADLs, Independent with household mobility with device, Independent with community mobility with device, Independent with transfers, and Requires assistive device for independence  PATIENT GOALS: "to maintain my independence"  OBJECTIVE:   DIAGNOSTIC FINDINGS:  See chart for more extensive review.  MRI PELVIS W AND WO CONTRAST, 04/18/2023 5:59 PM  IMPRESSION: CONCLUSION: 1. Redemonstrated changes of  chronic osteomyelitis in the right ischium, with improved signal compared to prior study. No evidence of acute osteomyelitis. 2. Interval slight decrease in size of the right buttock mass as above, favored large adventitial bursitis. Interval increased fluid component in lateral aspect of the bursa.  COGNITION: Overall cognitive status: Within functional limits for tasks assessed   SENSATION: T5 paraplegic   POSTURE: rounded shoulders, forward head, increased thoracic kyphosis, posterior pelvic tilt, and flexed trunk   LOWER EXTREMITY ROM:     Passive  Right Eval Left Eval  Hip flexion [facility]  Hip extension    Hip abduction [facility]  Hip adduction    Hip internal rotation    Hip external rotation    Knee flexion [facility]  Knee extension [facility]  Ankle dorsiflexion [facility]  Ankle plantarflexion [facility] WFL  Ankle inversion [facility] WFL  Ankle eversion WFL WFL   (Blank rows = not tested)   Of note: patient does exhibit PROM of BLE WFL in all available planes, however when placed in Modified Thomas stretch position he has onset of B hip flexor spasticity/tightness preventing full ROM.  LOWER EXTREMITY MMT:    MMT Right Eval Left Eval  Hip flexion    Hip extension    Hip abduction    Hip adduction    Hip internal rotation    Hip external rotation    Knee flexion    Knee extension    Ankle dorsiflexion    Ankle plantarflexion    Ankle inversion    Ankle eversion    (Blank rows = not tested)   BLE WFL in supine but when in Modified Thomas position has onset of spasticity in hips Want to try standing frame but need to check bone density first  Not comfortable in prone, has onset of hip spasticity/tightness as well as all around back  Pain jumps from side to side  On several meds for spasticity L side of trunk is weaker  BED MOBILITY:  Mod I  TRANSFERS: Assistive device utilized: Wheelchair (manual)  Sit to stand:  unable (T5 para) Stand to sit:  unable (T5  para) Chair to chair: Modified independence Floor: Total A    FUNCTIONAL TESTS:  FIST:  26/56   TODAY'S TREATMENT:                                                                                                                               Vitals:   07/01/23 1025  BP: (!) 142/73  Pulse: 87   BP assessed due to pt complaints of pain this AM. BP slightly elevated but within safe range.  TherAct Trigger Point Dry-Needling  Treatment instructions: Expect mild to moderate muscle soreness. S/S of pneumothorax if dry needled over a lung field, and to seek immediate medical attention should they occur. Patient verbalized understanding of these instructions and education.  Patient Consent Given: Yes Education handout provided: No (pt has received TPDN previously) Muscles treated: L and R suboccipitals, L SCM Treatment response/outcome: deep ache/muscle cramp with muscle twitch detected; mild relief of pain and tightness   Demonstrated how to perform suboccipital release independently with use of equipment: Suboccipital release with tennis balls x 5 min Chirp Wheel XR suboccipital release x 5 min   Provided handout for where to purchase Chirp Wheel and how to place tennis balls for suboccipital release.    PATIENT EDUCATION: Education details: continue HEP, added to [employer], handout for [employer] Person educated: Patient Education method: Explanation, Demonstration, Tactile cues, Verbal cues, and Handouts Education comprehension: verbalized understanding, returned demonstration, and needs further education  HOME EXERCISE PROGRAM: Access Code: [email or web address] URL: [email or web address] Date: 06/04/2023 Prepared by: Peter Congo  Exercises - Supine Shoulder External Rotation Stretch  - 1 x daily - 7 x weekly - 3 sets - 10 reps - Seated Scapular Retraction  - 1 x daily - 7 x weekly - 3 sets - 10 reps - Supine Bilateral Shoulder Protraction  - 1 x daily - 7 x weekly - 3  sets - 10 reps - Seated Shoulder Scaption AROM  - 1 x daily - 7 x weekly - 3 sets - 10 reps - Supine Thoracic Mobilization Towel Roll Vertical with Arm Stretch  - 1 x daily - 7 x weekly - 3 sets - 10 reps - Prone Hip Extension with Bent Knee - One Pillow  - 1 x daily - 7 x weekly - 1 sets - 1 reps - 10 min hold - Modified Thomas Stretch  - 1 x daily - 7 x weekly - 1 sets - 5 reps - 30 sec hold - Supine Suboccipital Release with Tennis Balls  - 1 x daily - 7 x weekly - 1 sets - 1 reps - 5-10 min hold   GOALS: Goals reviewed with patient? Yes  SHORT TERM GOALS: Target date: 06/24/2023  Pt will be independent with initial HEP for improved strength, balance, transfers and ability to complete ADLs independently. Baseline: Goal status: MET   LONG  TERM GOALS: Target date: 07/15/2023   Pt will be independent with final HEP for improved strength, balance, transfers and ability to complete ADLs independently. Baseline:  Goal status: INITIAL  2.  Patient will improve Function In Sitting Test to 30/56 to indicate a clinically meaningful change in sensory, motor, proactive, reactive, and steady static seated balance needed to help maintain proper prolonged positioning while sitting in a wheelchair as well as for ability to perform functional activities.  Baseline: 26/56 (8/23) Goal status: INITIAL  3.  Pt to improve score on the SPADI to 20% disability to demonstrate decreased disability level. Baseline: 30% (9/17) Goal status: INITIAL   ASSESSMENT:  CLINICAL IMPRESSION: Emphasis of skilled PT session on assessing vitals due to pt complaints this date, performing TPDN and introducing suboccipital release to address pain and tightness in cervical muscles. Pt with some relief of symptoms following treatment this date. Pt continues to benefit from skilled therapy services to address ongoing pain and to increase UB strength and control to increase independence with functional mobility and ADLs.   Continue POC.    OBJECTIVE IMPAIRMENTS: decreased activity tolerance, decreased balance, decreased mobility, difficulty walking, decreased ROM, decreased strength, increased fascial restrictions, impaired perceived functional ability, increased muscle spasms, impaired sensation, impaired tone, impaired UE functional use, improper body mechanics, postural dysfunction, and pain.   ACTIVITY LIMITATIONS: carrying, lifting, bending, standing, squatting, stairs, and hygiene/grooming  PARTICIPATION LIMITATIONS: driving and community activity  PERSONAL FACTORS: Time since onset of injury/illness/exacerbation and 1-2 comorbidities:    T5 paraplegia, B torn RTCs, and cervical stenosis with posterior fusion.are also affecting patient's functional outcome.   REHAB POTENTIAL: Fair time since onset of SCI, torn RTCs  CLINICAL DECISION MAKING: Stable/uncomplicated  EVALUATION COMPLEXITY: High  PLAN:  PT FREQUENCY: 1x/week  PT DURATION: 6 weeks  PLANNED INTERVENTIONS: Therapeutic exercises, Therapeutic activity, Neuromuscular re-education, Balance training, Gait training, Patient/Family education, Self Care, Joint mobilization, Dry Needling, Electrical stimulation, Wheelchair mobility training, Spinal manipulation, Spinal mobilization, Cryotherapy, Moist heat, Taping, Manual therapy, and Re-evaluation  PLAN FOR NEXT SESSION: how is HEP? Progress towards STOMPS as able,  TPDN, piriformis stretch, SKTC, LTR, prone press-ups? IYT in sitting or prone? Biceps tear or tendonitis-what did ortho doc say?, results of xrays? - if so can working on standing frame (assess BP!)    Peter Congo, PT, DPT, CSRS  07/01/2023, 11:05 AM

## 2023-07-10 ENCOUNTER — Ambulatory Visit: Payer: 59 | Attending: Family Medicine | Admitting: Physical Therapy

## 2023-07-16 ENCOUNTER — Ambulatory Visit: Payer: 59 | Attending: Family Medicine | Admitting: Physical Therapy

## 2023-07-16 VITALS — BP 122/89

## 2023-07-16 DIAGNOSIS — M25512 Pain in left shoulder: Secondary | ICD-10-CM | POA: Diagnosis present

## 2023-07-16 DIAGNOSIS — M546 Pain in thoracic spine: Secondary | ICD-10-CM | POA: Insufficient documentation

## 2023-07-16 DIAGNOSIS — M25552 Pain in left hip: Secondary | ICD-10-CM | POA: Diagnosis present

## 2023-07-16 DIAGNOSIS — G8929 Other chronic pain: Secondary | ICD-10-CM | POA: Insufficient documentation

## 2023-07-16 DIAGNOSIS — M25511 Pain in right shoulder: Secondary | ICD-10-CM | POA: Diagnosis present

## 2023-07-16 DIAGNOSIS — M25551 Pain in right hip: Secondary | ICD-10-CM | POA: Diagnosis present

## 2023-07-16 DIAGNOSIS — M6281 Muscle weakness (generalized): Secondary | ICD-10-CM | POA: Insufficient documentation

## 2023-07-16 DIAGNOSIS — M5459 Other low back pain: Secondary | ICD-10-CM | POA: Diagnosis present

## 2023-07-16 NOTE — Therapy (Signed)
OUTPATIENT PHYSICAL THERAPY NEURO TREATMENT   Patient Name: Devin Collins MRN: [id number] DOB:1981/07/31, 42 y.o., male Today's Date: 07/16/2023   PCP: Dorian Heckle, MD (per chart) REFERRING PROVIDER: Genice Rouge, MD  END OF SESSION:  PT End of Session - 07/16/23 1022     Visit Number 6    Number of Visits 7   with eval   Date for PT Re-Evaluation 07/25/23    Authorization Type [employer]    PT Start Time 1020   pt arrived late   PT Stop Time 1102    PT Time Calculation (min) 42 min    Activity Tolerance Patient tolerated treatment well    Behavior During Therapy WFL for tasks assessed/performed                  Past Medical History:  Diagnosis Date   Arthritis    Decubitus ulcer of sacral region, stage 4 (HCC) 10/03/2017   Post-traumatic paraplegia 2004   Scoliosis    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[facility])    Past Surgical History:  Procedure Laterality Date   COSMETIC SURGERY  10/05/2018   flap closure sacral wound   IVC FILTER INSERTION     posterior lumbar laminectomy and fusion  12/03/2018   SPINE SURGERY  11/09/2018   lumbar fusion    THORACIC FUSION     T10-11   THORACIC LAMINECTOMY  08/2005   Patient Active Problem List   Diagnosis Date Noted   Ulnar neuropathy at elbow, left 10/31/2021   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   Torticollis, acquired 07/30/2021   Post-traumatic paraplegia 06/05/2021   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   Hip pain, bilateral 01/05/2021   Vitamin D deficiency 01/05/2021   Myofascial pain 01/05/2021   Loss of balance 11/17/2020   Neurogenic bladder 11/17/2020   Wheelchair dependence 07/21/2020   Paraplegia (HCC) 05/22/2020   Spasticity 05/22/2020    ONSET DATE: 05/07/2023 (referral date)  REFERRING DIAG: G82.20 (ICD-10-CM) - Paraplegia (HCC) M79.18 (ICD-10-CM) - Myofascial pain Z99.3 (ICD-10-CM) - Wheelchair dependence  THERAPY DIAG:  Muscle weakness (generalized)  Chronic pain of both  shoulders  Pain in thoracic spine  Pain of both hip joints  Other low back pain  Rationale for Evaluation and Treatment: Rehabilitation  SUBJECTIVE:                                                                                                                                                                                             SUBJECTIVE STATEMENT: Pt reports he is feeling much better today, "I'm just happy to be alive". Pt reports ongoing  popping in his hips, butt, etc.  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     PAIN:  Are you having pain? Yes: NPRS scale: 5/10 Pain location: everywhere Pain description: "popping" Aggravating factors: laying prone, any movement Relieving factors: N/A  Pt also states from an aggravation point of view pain is 12/10  PRECAUTIONS: Fall  RED FLAGS: None   WEIGHT BEARING RESTRICTIONS: No  FALLS: Has patient fallen in last 6 months? Yes. Number of falls unknown, one witnessed fall by this therapist that needed total A to return to w/c from the ground  LIVING ENVIRONMENT: Lives with: lives with their family Lives in: House/apartment Home is accessible to patient  PLOF: Independent with basic ADLs, Independent with household mobility with device, Independent with community mobility with device, Independent with transfers, and Requires assistive device for independence  PATIENT GOALS: "to maintain my independence"  OBJECTIVE:   DIAGNOSTIC FINDINGS:  See chart for more extensive review.  MRI PELVIS W AND WO CONTRAST, 04/18/2023 5:59 PM  IMPRESSION: CONCLUSION: 1. 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 No evidence of acute  osteomyelitis. 2. Interval slight decrease in size of the right buttock mass as above, favored large adventitial bursitis. Interval increased fluid component in lateral aspect of the bursa.  COGNITION: Overall cognitive status: Within functional limits for tasks assessed   SENSATION: T5 paraplegic   POSTURE: rounded shoulders, forward head, increased thoracic kyphosis, posterior pelvic tilt, and flexed trunk   LOWER EXTREMITY ROM:     Passive  Right Eval Left Eval  Hip flexion [facility]  Hip extension    Hip abduction [facility]  Hip adduction    Hip internal rotation    Hip external rotation    Knee flexion [facility]  Knee extension [facility]  Ankle dorsiflexion [facility]  Ankle plantarflexion [facility] WFL  Ankle inversion [facility] WFL  Ankle eversion WFL WFL   (Blank rows = not tested)   Of note: patient does exhibit PROM of BLE WFL in all available planes, however when placed in Modified Thomas stretch position he has onset of B hip flexor spasticity/tightness preventing full ROM.  LOWER EXTREMITY MMT:    MMT Right Eval Left Eval  Hip flexion    Hip extension    Hip abduction    Hip adduction    Hip internal rotation    Hip external rotation    Knee flexion    Knee extension    Ankle dorsiflexion    Ankle plantarflexion    Ankle inversion    Ankle eversion    (Blank rows = not tested)   BLE WFL in supine but when in Modified Thomas position has onset of spasticity in hips Want to try standing frame but need to check bone density first  Not comfortable in prone, has onset of hip spasticity/tightness as well as all around back  Pain jumps from side to side  On several meds for spasticity L side of trunk is weaker  BED MOBILITY:  Mod I  TRANSFERS: Assistive device utilized: Wheelchair (manual)  Sit to stand:  unable (T5 para) Stand to sit:  unable (T5 para) Chair to chair: Modified independence Floor: Total A    FUNCTIONAL TESTS:  FIST:  26/56   TODAY'S  TREATMENT:                                                                                                                               Vitals:   07/16/23 1305  BP: 122/89   BP assessed in sitting at beginning of session. Did not assess BP further during session but no signs/symptoms of OH with standing this session.   NMR Session focus on initiation of use of standing frame for weight-bearing through BLE to improve bone health, spasticity management, improve circulation, improve skin health and decrease risk for pressure injuries, improve bowel and bladder function, and decrease risk for contractures. Pt performs squat pivot transfers w/c to/from mat table at mod I level. Assessed BP in sitting (as noted above) but not during session, no signs/symptoms of OH during standing. Sit to stand dependently with use of standing frame sling. Pt able to tolerate standing x 7 min, x 10 min. Pt does have some onset of L lateral lean with fatigue but it improves with tightening of strap. Pt very excited by ability to stand this date as this is the first time he has stood since his injury!  Discussed benefits of standing and pt interested in pursuing obtaining a standing frame for use at home in the future. Will continue to work on tolerance for standing frame during therapy sessions.    PATIENT EDUCATION: Education details: continue HEP, standing frame Person educated: Patient Education method: Explanation Education comprehension: verbalized understanding  HOME EXERCISE PROGRAM: Access Code: [email or web address] URL: [email or web address] Date: 06/04/2023 Prepared by: Peter Congo  Exercises - Supine Shoulder External Rotation Stretch  - 1 x daily - 7 x weekly - 3 sets - 10 reps - Seated Scapular Retraction  - 1 x daily - 7 x weekly - 3 sets - 10 reps - Supine Bilateral Shoulder Protraction  - 1 x daily - 7 x weekly - 3 sets - 10 reps - Seated Shoulder Scaption AROM  - 1 x daily - 7 x weekly  - 3 sets - 10 reps - Supine Thoracic Mobilization Towel Roll Vertical with Arm Stretch  - 1 x daily - 7 x weekly - 3 sets - 10 reps - Prone Hip Extension with Bent Knee - One Pillow  - 1 x daily - 7 x weekly - 1 sets - 1 reps - 10 min hold - Modified Thomas Stretch  - 1 x daily - 7 x weekly - 1 sets - 5 reps - 30 sec hold - Supine Suboccipital Release with Tennis Balls  - 1 x daily - 7 x weekly - 1 sets - 1 reps - 5-10 min hold   GOALS: Goals reviewed with patient? Yes  SHORT TERM GOALS: Target date: 06/24/2023  Pt will be independent with initial HEP for improved strength, balance, transfers and ability to complete ADLs independently. Baseline: Goal status: MET   LONG TERM GOALS: Target  date: 07/25/2023 (update to match recert date)   Pt will be independent with final HEP for improved strength, balance, transfers and ability to complete ADLs independently. Baseline:  Goal status: INITIAL  2.  Patient will improve Function In Sitting Test to 30/56 to indicate a clinically meaningful change in sensory, motor, proactive, reactive, and steady static seated balance needed to help maintain proper prolonged positioning while sitting in a wheelchair as well as for ability to perform functional activities.  Baseline: 26/56 (8/23) Goal status: INITIAL  3.  Pt to improve score on the SPADI to 20% disability to demonstrate decreased disability level. Baseline: 30% (9/17) Goal status: INITIAL   ASSESSMENT:  CLINICAL IMPRESSION: Emphasis of skilled PT session on initiating use of standing frame this date for above-mentioned benefits. Pt with great response to standing with no signs/symptoms of OH and relief of back and stretching felt in B hips in standing. Pt very excited/encouraged by his ability to stand with use of standing frame this date this good tolerance noted. Pt continues to benefit from skilled therapy services to work towards LTGs and possibly obtaining a standing device for use at  home. Continue POC.    OBJECTIVE IMPAIRMENTS: decreased activity tolerance, decreased balance, decreased mobility, difficulty walking, decreased ROM, decreased strength, increased fascial restrictions, impaired perceived functional ability, increased muscle spasms, impaired sensation, impaired tone, impaired UE functional use, improper body mechanics, postural dysfunction, and pain.   ACTIVITY LIMITATIONS: carrying, lifting, bending, standing, squatting, stairs, and hygiene/grooming  PARTICIPATION LIMITATIONS: driving and community activity  PERSONAL FACTORS: Time since onset of injury/illness/exacerbation and 1-2 comorbidities:    T5 paraplegia, B torn RTCs, and cervical stenosis with posterior fusion.are also affecting patient's functional outcome.   REHAB POTENTIAL: Fair time since onset of SCI, torn RTCs  CLINICAL DECISION MAKING: Stable/uncomplicated  EVALUATION COMPLEXITY: High  PLAN:  PT FREQUENCY: 1x/week  PT DURATION: 6 weeks  PLANNED INTERVENTIONS: Therapeutic exercises, Therapeutic activity, Neuromuscular re-education, Balance training, Gait training, Patient/Family education, Self Care, Joint mobilization, Dry Needling, Electrical stimulation, Wheelchair mobility training, Spinal manipulation, Spinal mobilization, Cryotherapy, Moist heat, Taping, Manual therapy, and Re-evaluation  PLAN FOR NEXT SESSION: how is HEP? Progress towards STOMPS as able,  TPDN, piriformis stretch, SKTC, LTR, prone press-ups? IYT in sitting or prone? Biceps tear or tendonitis-what did ortho doc say?, standing frame-needle hips after standing, ekso, asking about pressure mapping with Barbara Cower?    Peter Congo, PT, DPT, CSRS  07/16/2023, 1:06 PM

## 2023-07-18 ENCOUNTER — Encounter: Payer: 59 | Admitting: Physical Medicine and Rehabilitation

## 2023-07-18 DIAGNOSIS — M7918 Myalgia, other site: Secondary | ICD-10-CM

## 2023-07-22 ENCOUNTER — Ambulatory Visit: Payer: 59 | Admitting: Physical Therapy

## 2023-07-22 ENCOUNTER — Telehealth: Payer: Self-pay | Admitting: Physical Therapy

## 2023-07-22 DIAGNOSIS — M546 Pain in thoracic spine: Secondary | ICD-10-CM

## 2023-07-22 DIAGNOSIS — M25551 Pain in right hip: Secondary | ICD-10-CM

## 2023-07-22 DIAGNOSIS — G8929 Other chronic pain: Secondary | ICD-10-CM

## 2023-07-22 DIAGNOSIS — M6281 Muscle weakness (generalized): Secondary | ICD-10-CM

## 2023-07-22 DIAGNOSIS — M5459 Other low back pain: Secondary | ICD-10-CM

## 2023-07-22 NOTE — Therapy (Signed)
OUTPATIENT PHYSICAL THERAPY NEURO TREATMENT - RECERTIFICATION   Patient Name: Devin Collins MRN: [id number] DOB:1980/11/04, 42 y.o., male Today's Date: 07/22/2023   PCP: Dorian Heckle, MD (per chart) REFERRING PROVIDER: Genice Rouge, MD  END OF SESSION:  PT End of Session - 07/22/23 1316     Visit Number 7    Number of Visits 17   recert   Date for PT Re-Evaluation 10/14/23   recert, to allow for scheduling delays   Authorization Type [employer]    Progress Note Due on Visit 10    PT Start Time 1315    PT Stop Time 1404    PT Time Calculation (min) 49 min    Activity Tolerance Patient tolerated treatment well    Behavior During Therapy [facility] for tasks assessed/performed                   Past Medical History:  Diagnosis Date   Arthritis    Decubitus ulcer of sacral region, stage 4 (HCC) 10/03/2017   Post-traumatic paraplegia 2004   Scoliosis    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[facility])    Past Surgical History:  Procedure Laterality Date   COSMETIC SURGERY  10/05/2018   flap closure sacral wound   IVC FILTER INSERTION     posterior lumbar laminectomy and fusion  12/03/2018   SPINE SURGERY  11/09/2018   lumbar fusion    THORACIC FUSION     T10-11   THORACIC LAMINECTOMY  08/2005   Patient Active Problem List   Diagnosis Date Noted   Ulnar neuropathy at elbow, left 10/31/2021   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   Torticollis, acquired 07/30/2021   Post-traumatic paraplegia 06/05/2021   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   Hip pain, bilateral 01/05/2021   Vitamin D deficiency 01/05/2021   Myofascial pain 01/05/2021   Loss of balance 11/17/2020   Neurogenic bladder 11/17/2020   Wheelchair dependence 07/21/2020   Paraplegia (HCC) 05/22/2020   Spasticity 05/22/2020    ONSET DATE: 05/07/2023 (referral date)  REFERRING DIAG: G82.20 (ICD-10-CM) - Paraplegia (HCC) M79.18 (ICD-10-CM) - Myofascial pain Z99.3 (ICD-10-CM) - Wheelchair  dependence  THERAPY DIAG:  Muscle weakness (generalized)  Chronic pain of both shoulders  Pain in thoracic spine  Pain of both hip joints  Other low back pain  Rationale for Evaluation and Treatment: Rehabilitation  SUBJECTIVE:                                                                                                                                                                                             SUBJECTIVE STATEMENT: Pt reports he  fell out of his wheelchair last Thursday while stretching, reports he "busted his head open" but he did not seek medical care, did not lose consciousness. Pt reports he is feeling better today and that he understands this therapist recommends he seek medical care after a fall especially once in which he has hit his head and is bleeding. Pt states his shoulders are doing "ok", they did pop the other day while reaching into his car and now he has pain from his R ribs around to his back. Pt reports he also feels his L lats popping when he reaches [place]. Pt still with difficulty lifting a jug of tea out of the fridge with his LUE, did lift a pain of hot water off the stove with his RUE.  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     PAIN:  Are you having pain? Yes: NPRS scale: 5/10 Pain location: everywhere Pain description: "popping" Aggravating factors: laying prone, any movement Relieving factors: N/A  Pt also states from an aggravation point of view pain is 12/10  PRECAUTIONS: Fall  RED FLAGS: None   WEIGHT BEARING RESTRICTIONS: No  FALLS: Has patient fallen in last 6 months? Yes. Number of falls unknown, one witnessed fall by this therapist that needed total A to return to w/c from the ground  LIVING  ENVIRONMENT: Lives with: lives with their family Lives in: House/apartment Home is accessible to patient  PLOF: Independent with basic ADLs, Independent with household mobility with device, Independent with community mobility with device, Independent with transfers, and Requires assistive device for independence  PATIENT GOALS: "to maintain my independence"  OBJECTIVE:   DIAGNOSTIC FINDINGS:  See chart for more extensive review.  MRI PELVIS W AND WO CONTRAST, 04/18/2023 5:59 PM  IMPRESSION: CONCLUSION: 1. 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 No evidence of acute osteomyelitis. 2. Interval slight decrease in size of the right buttock mass as above, favored large adventitial bursitis. Interval increased fluid component in lateral aspect of the bursa.  COGNITION: Overall cognitive status: Within functional limits for tasks assessed   SENSATION: T5 paraplegic   POSTURE: rounded shoulders, forward head, increased thoracic kyphosis, posterior pelvic tilt, and flexed trunk   LOWER EXTREMITY ROM:     Passive  Right Eval Left Eval  Hip flexion [facility]  Hip extension    Hip abduction [facility]  Hip adduction    Hip internal rotation    Hip external rotation    Knee flexion [facility]  Knee extension [facility]  Ankle dorsiflexion [facility]  Ankle plantarflexion [facility] WFL  Ankle inversion [facility] WFL  Ankle eversion WFL WFL   (Blank rows = not tested)   Of note: patient does exhibit PROM of BLE WFL in all available planes, however when placed in Modified Thomas stretch position he has onset of B hip flexor spasticity/tightness preventing full ROM.  LOWER EXTREMITY MMT:    MMT Right Eval Left Eval  Hip flexion    Hip extension    Hip abduction    Hip adduction    Hip internal rotation    Hip external rotation    Knee flexion    Knee extension    Ankle dorsiflexion  Ankle plantarflexion    Ankle  inversion    Ankle eversion    (Blank rows = not tested)   BLE WFL in supine but when in Modified Thomas position has onset of spasticity in hips Want to try standing frame but need to check bone density first  Not comfortable in prone, has onset of hip spasticity/tightness as well as all around back  Pain jumps from side to side  On several meds for spasticity L side of trunk is weaker  BED MOBILITY:  Mod I  TRANSFERS: Assistive device utilized: Wheelchair (manual)  Sit to stand:  unable (T5 para) Stand to sit:  unable (T5 para) Chair to chair: Modified independence Floor: Total A    FUNCTIONAL TESTS:  FIST:  26/56   TODAY'S TREATMENT:                                                                                                                               TherAct For LTG assessment: FIST: 27/56 SPADI: 50/130, 38% pain and disability level   Trigger Point Dry-Needling  Treatment instructions: Expect mild to moderate muscle soreness. S/S of pneumothorax if dry needled over a lung field, and to seek immediate medical attention should they occur. Patient verbalized understanding of these instructions and education.  Patient Consent Given: Yes Education handout provided: Previously provided Muscles treated: R lats, R upper trap, R splenius capitus Treatment response/outcome: decreased pain to 4/10 from 8/10 in neck and shoulder; muscle twitch detected     PATIENT EDUCATION: Education details: continue HEP, PT POC with plan for next few sessions (pressure mapping with Jason, standing frame x 15 min, shoulder and periscapular strengthening) Person educated: Patient Education method: Explanation Education comprehension: verbalized understanding  HOME EXERCISE PROGRAM: Access Code: [email or web address] URL: [email or web address] Date: 06/04/2023 Prepared by: Peter Congo  Exercises - Supine Shoulder External Rotation Stretch  - 1 x daily - 7 x weekly - 3  sets - 10 reps - Seated Scapular Retraction  - 1 x daily - 7 x weekly - 3 sets - 10 reps - Supine Bilateral Shoulder Protraction  - 1 x daily - 7 x weekly - 3 sets - 10 reps - Seated Shoulder Scaption AROM  - 1 x daily - 7 x weekly - 3 sets - 10 reps - Supine Thoracic Mobilization Towel Roll Vertical with Arm Stretch  - 1 x daily - 7 x weekly - 3 sets - 10 reps - Prone Hip Extension with Bent Knee - One Pillow  - 1 x daily - 7 x weekly - 1 sets - 1 reps - 10 min hold - Modified Thomas Stretch  - 1 x daily - 7 x weekly - 1 sets - 5 reps - 30 sec hold - Supine Suboccipital Release with Tennis Balls  - 1 x daily - 7 x weekly - 1 sets - 1 reps - 5-10 min hold   GOALS: Goals reviewed with patient? Yes  SHORT TERM GOALS: Target date: 06/24/2023  Pt will be independent with initial HEP for improved strength, balance, transfers and ability to complete ADLs independently. Baseline: Goal status: MET   LONG TERM GOALS: Target date: 07/25/2023 (update to match recert date)   Pt will be independent with final HEP for improved strength, balance, transfers and ability to complete ADLs independently. Baseline:  Goal status: IN PROGRESS  2.  Patient will improve Function In Sitting Test to 30/56 to indicate a clinically meaningful change in sensory, motor, proactive, reactive, and steady static seated balance needed to help maintain proper prolonged positioning while sitting in a wheelchair as well as for ability to perform functional activities.  Baseline: 26/56 (8/23), 27/56 (10/15) Goal status: NOT MET  3.  Pt to improve score on the SPADI to 20% disability to demonstrate decreased disability level. Baseline: 30% (9/17), 38% disability (10/15) Goal status: NOT MET   NEW SHORT TERM GOALS:   Target date: 08/19/2023  Pt to trial standing frame and tolerate standing x 15 min with no adverse affects Baseline: 10 min (10/9) Goal status: INITIAL   NEW LONG TERM GOALS:  Target date:  09/16/2023  Pt will be independent with final HEP for improved strength, balance, transfers and ability to complete ADLs independently. Baseline: initial HEP established Goal status: IN PROGRESS  2.  Pt to improve score on the SPADI to 20% disability to demonstrate decreased disability level. Baseline: 30% (9/17), 38% disability (10/15) Goal status: IN PROGRESS  3.  Pt will tolerate standing in standing frame x 30 min to work on weight-bearing through BLE to improve bone health, spasticity management, improve circulation, improve skin health and decrease risk for pressure injuries, improve bowel and bladder function, and decrease risk for contractures.  Baseline: 10 min (10/9) Goal status: INITIAL    ASSESSMENT:  CLINICAL IMPRESSION: Emphasis of skilled PT session on assessing LTG in preparation for recertification of PT services this date. Pt is making progress towards 2/3 LTG assessed this date due to being independent with his initial HEP, has not yet been given a final HEP. Pt has also improved his sitting balance based on an improved FIST score, however due to the level of his SCI he will likely not progress with his score much beyond where he is already at. Additionally, due to ongoing shoulder dysfunction and torn B RTCs he continues to score with a higher pain and disability level on the SPADI. Pt continues to benefit from skilled therapy services to work towards increased tolerance/time spent in standing frame for above-mentioned benefits as well as to continue working towards shoulder and periscapular strengthening in order to increase his safety and independence with functional mobility and ADLs and increase his independence with management of his pain symptoms. Continue POC.    OBJECTIVE IMPAIRMENTS: decreased activity tolerance, decreased balance, decreased mobility, difficulty walking, decreased ROM, decreased strength, increased fascial restrictions, impaired perceived  functional ability, increased muscle spasms, impaired sensation, impaired tone, impaired UE functional use, improper body mechanics, postural dysfunction, and pain.   ACTIVITY LIMITATIONS: carrying, lifting, bending, standing, squatting, stairs, and hygiene/grooming  PARTICIPATION LIMITATIONS: driving and community activity  PERSONAL FACTORS: Time since onset of injury/illness/exacerbation and 1-2 comorbidities:    T5 paraplegia, B torn RTCs, and cervical stenosis with posterior fusion.are also affecting patient's functional outcome.   REHAB POTENTIAL: Fair time since onset of SCI, torn RTCs  CLINICAL DECISION MAKING: Stable/uncomplicated  EVALUATION COMPLEXITY: High  PLAN:  PT FREQUENCY: 1x/week  PT DURATION: 6 weeks +  10 visits (recert)  PLANNED INTERVENTIONS: Therapeutic exercises, Therapeutic activity, Neuromuscular re-education, Balance training, Gait training, Patient/Family education, Self Care, Joint mobilization, Dry Needling, Electrical stimulation, Wheelchair mobility training, Spinal manipulation, Spinal mobilization, Cryotherapy, Moist heat, Taping, Manual therapy, and Re-evaluation  PLAN FOR NEXT SESSION: pressure mapping with Jason, standing frame x 10 min then needle hips, work on shoulder and periscapular strengthening, how is HEP? Progress towards STOMPS as able,  TPDN, piriformis stretch, SKTC, LTR, prone press-ups? IYT in sitting or prone? Biceps tear or tendonitis-what did ortho doc say?,     Peter Congo, PT, DPT, CSRS  07/22/2023, 3:04 PM

## 2023-07-22 NOTE — Telephone Encounter (Signed)
Dr. Berline Chough,  I believe that Devin Collins could benefit from obtaining a standing frame for use at home so that he can work on increasing his time spent in standing. He did great the first time we worked on standing (no orthostatic hypotension) and his posture looked really good. He had relief of pain in his hips and back following standing.  If you agree, please submit the appropriate order for a standing frame and indicate in your next visit note for him your recommendation of a standing frame if you agree.  Thank you, Peter Congo, PT, DPT, [facility] [address] [place], [place]  [address] Phone:  [phone] Fax:  [phone]

## 2023-07-30 ENCOUNTER — Ambulatory Visit: Payer: 59 | Admitting: Physical Medicine and Rehabilitation

## 2023-07-31 ENCOUNTER — Ambulatory Visit: Payer: 59 | Admitting: Physical Therapy

## 2023-07-31 DIAGNOSIS — M25551 Pain in right hip: Secondary | ICD-10-CM

## 2023-07-31 DIAGNOSIS — M5459 Other low back pain: Secondary | ICD-10-CM

## 2023-07-31 DIAGNOSIS — M6281 Muscle weakness (generalized): Secondary | ICD-10-CM | POA: Diagnosis not present

## 2023-07-31 DIAGNOSIS — M546 Pain in thoracic spine: Secondary | ICD-10-CM

## 2023-07-31 DIAGNOSIS — G8929 Other chronic pain: Secondary | ICD-10-CM

## 2023-07-31 NOTE — Therapy (Signed)
OUTPATIENT PHYSICAL THERAPY NEURO TREATMENT   Patient Name: Devin Collins MRN: [id number] DOB:1981/04/24, 42 y.o., male Today's Date: 07/31/2023   PCP: Dorian Heckle, MD (per chart) REFERRING PROVIDER: Genice Rouge, MD  END OF SESSION:  PT End of Session - 07/31/23 1449     Visit Number 8    Number of Visits 17   recert   Date for PT Re-Evaluation 10/14/23   recert, to allow for scheduling delays   Authorization Type [employer]    Progress Note Due on Visit 10    PT Start Time 1449    PT Stop Time 1530    PT Time Calculation (min) 41 min    Activity Tolerance Patient tolerated treatment well    Behavior During Therapy [facility] for tasks assessed/performed                    Past Medical History:  Diagnosis Date   Arthritis    Decubitus ulcer of sacral region, stage 4 (HCC) 10/03/2017   Post-traumatic paraplegia 2004   Scoliosis    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[facility])    Past Surgical History:  Procedure Laterality Date   COSMETIC SURGERY  10/05/2018   flap closure sacral wound   IVC FILTER INSERTION     posterior lumbar laminectomy and fusion  12/03/2018   SPINE SURGERY  11/09/2018   lumbar fusion    THORACIC FUSION     T10-11   THORACIC LAMINECTOMY  08/2005   Patient Active Problem List   Diagnosis Date Noted   Ulnar neuropathy at elbow, left 10/31/2021   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   Torticollis, acquired 07/30/2021   Post-traumatic paraplegia 06/05/2021   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   Hip pain, bilateral 01/05/2021   Vitamin D deficiency 01/05/2021   Myofascial pain 01/05/2021   Loss of balance 11/17/2020   Neurogenic bladder 11/17/2020   Wheelchair dependence 07/21/2020   Paraplegia (HCC) 05/22/2020   Spasticity 05/22/2020    ONSET DATE: 05/07/2023 (referral date)  REFERRING DIAG: G82.20 (ICD-10-CM) - Paraplegia (HCC) M79.18 (ICD-10-CM) - Myofascial pain Z99.3 (ICD-10-CM) - Wheelchair dependence  THERAPY DIAG:   Muscle weakness (generalized)  Chronic pain of both shoulders  Pain in thoracic spine  Pain of both hip joints  Other low back pain  Rationale for Evaluation and Treatment: Rehabilitation  SUBJECTIVE:                                                                                                                                                                                             SUBJECTIVE STATEMENT: Pt denies any falls  since last visit, still feeling tender on his head from his previous fall. Pt reports that when he pulls on his toe feels pulling in his paraspinals, R>L.  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     PAIN:  Are you having pain? Yes: NPRS scale: 5/10 Pain location: everywhere Pain description: "popping" Aggravating factors: laying prone, any movement Relieving factors: N/A  Pt also states from an aggravation point of view pain is 12/10  PRECAUTIONS: Fall  RED FLAGS: None   WEIGHT BEARING RESTRICTIONS: No  FALLS: Has patient fallen in last 6 months? Yes. Number of falls unknown, one witnessed fall by this therapist that needed total A to return to w/c from the ground  LIVING ENVIRONMENT: Lives with: lives with their family Lives in: House/apartment Home is accessible to patient  PLOF: Independent with basic ADLs, Independent with household mobility with device, Independent with community mobility with device, Independent with transfers, and Requires assistive device for independence  PATIENT GOALS: "to maintain my independence"  OBJECTIVE:   DIAGNOSTIC FINDINGS:  See chart for more extensive review.  MRI PELVIS W AND WO CONTRAST, 04/18/2023 5:59 PM  IMPRESSION: CONCLUSION: 1. Redemonstrated changes of chronic  osteomyelitis in the right ischium, with improved signal compared to prior study. No evidence of acute osteomyelitis. 2. Interval slight decrease in size of the right buttock mass as above, favored large adventitial bursitis. Interval increased fluid component in lateral aspect of the bursa.  COGNITION: Overall cognitive status: Within functional limits for tasks assessed   SENSATION: T5 paraplegic   POSTURE: rounded shoulders, forward head, increased thoracic kyphosis, posterior pelvic tilt, and flexed trunk   LOWER EXTREMITY ROM:     Passive  Right Eval Left Eval  Hip flexion [facility]  Hip extension    Hip abduction [facility]  Hip adduction    Hip internal rotation    Hip external rotation    Knee flexion [facility]  Knee extension [facility]  Ankle dorsiflexion [facility]  Ankle plantarflexion [facility] WFL  Ankle inversion [facility] WFL  Ankle eversion WFL WFL   (Blank rows = not tested)   Of note: patient does exhibit PROM of BLE WFL in all available planes, however when placed in Modified Thomas stretch position he has onset of B hip flexor spasticity/tightness preventing full ROM.  LOWER EXTREMITY MMT:    MMT Right Eval Left Eval  Hip flexion    Hip extension    Hip abduction    Hip adduction    Hip internal rotation    Hip external rotation    Knee flexion    Knee extension    Ankle dorsiflexion    Ankle plantarflexion    Ankle inversion    Ankle eversion    (Blank rows = not tested)   BLE WFL in supine but when in Modified Thomas position has onset of spasticity in hips Want to try standing frame but need to check bone density first  Not comfortable in prone, has onset of hip spasticity/tightness as well as all around back  Pain jumps from side to side  On several meds for spasticity L side of trunk is weaker  BED MOBILITY:  Mod I  TRANSFERS: Assistive device utilized: Wheelchair (manual)  Sit to stand:  unable (  T5 para) Stand to sit:  unable (T5 para) Chair to  chair: Modified independence Floor: Total A   TODAY'S TREATMENT:                                                                                                                               NMR Session focus on use of standing frame for weight-bearing through BLE to improve bone health, spasticity management, improve circulation, improve skin health and decrease risk for pressure injuries, improve bowel and bladder function, and decrease risk for contractures. Pt performs squat pivot transfers w/c to/from mat table at mod I level. No signs/symptoms of OH during standing so BP not assessed this session. Sit to stand dependently with use of standing frame sling. Pt able to tolerate standing x 10 min. During standing pt able to work on alt L/R shoulder flexion and abduction x 5 reps each as well as scap squeezes. Pt has increased pain in L shoulder > R shoulder during shoulder AROM with decreased AROM noted in L shoulder as compared to R shoulder. Pt with increase in pain in L shoulder blade with scap squeezes. Pt also able to perform cervical rotation L/R with minimal limitation noted and lateral cervical flexion L/R with limited ROM noted.   TherAct Supine PROM L shoulder flexion, abduction, IR/ER with manual therapy to L scapula due to pt reports of tightness in this region. Pt with some relief of pain and tightness following stretching. Prone hip flexor stretch x 5 min due to increased spasticity in hip flexors following standing frame. Pt with decreased B hip tightness following stretching.   PATIENT EDUCATION: Education details: continue HEP Person educated: Patient Education method: Explanation Education comprehension: verbalized understanding  HOME EXERCISE PROGRAM: Access Code: [email or web address] URL: [email or web address] Date: 06/04/2023 Prepared by: Peter Congo  Exercises - Supine Shoulder External Rotation Stretch  - 1 x daily - 7 x weekly - 3 sets - 10 reps - Seated  Scapular Retraction  - 1 x daily - 7 x weekly - 3 sets - 10 reps - Supine Bilateral Shoulder Protraction  - 1 x daily - 7 x weekly - 3 sets - 10 reps - Seated Shoulder Scaption AROM  - 1 x daily - 7 x weekly - 3 sets - 10 reps - Supine Thoracic Mobilization Towel Roll Vertical with Arm Stretch  - 1 x daily - 7 x weekly - 3 sets - 10 reps - Prone Hip Extension with Bent Knee - One Pillow  - 1 x daily - 7 x weekly - 1 sets - 1 reps - 10 min hold - Modified Thomas Stretch  - 1 x daily - 7 x weekly - 1 sets - 5 reps - 30 sec hold - Supine Suboccipital Release with Tennis Balls  - 1 x daily - 7 x weekly - 1 sets - 1 reps - 5-10 min hold   GOALS: Goals reviewed with patient? Yes   NEW  SHORT TERM GOALS:   Target date: 08/19/2023  Pt to trial standing frame and tolerate standing x 15 min with no adverse affects Baseline: 10 min (10/9) Goal status: INITIAL   NEW LONG TERM GOALS:  Target date: 09/16/2023  Pt will be independent with final HEP for improved strength, balance, transfers and ability to complete ADLs independently. Baseline: initial HEP established Goal status: IN PROGRESS  2.  Pt to improve score on the SPADI to 20% disability to demonstrate decreased disability level. Baseline: 30% (9/17), 38% disability (10/15) Goal status: IN PROGRESS  3.  Pt will tolerate standing in standing frame x 30 min to work on weight-bearing through BLE to improve bone health, spasticity management, improve circulation, improve skin health and decrease risk for pressure injuries, improve bowel and bladder function, and decrease risk for contractures.  Baseline: 10 min (10/9) Goal status: INITIAL    ASSESSMENT:  CLINICAL IMPRESSION: Emphasis of skilled PT session on working on standing tolerance with use of standing frame as well as continuing to work on stretching of UE and hips due to ongoing pain and tightness. Pt with good tolerance for standing this date, able to stand x 10 min with no  signs/symptoms of OH. Pt does continue to exhibit muscle tightness throughout his entire body leading to increased pain and impaired function. Pt with an improvement in pain symptoms following stretching and standing this session. Continue POC.    OBJECTIVE IMPAIRMENTS: decreased activity tolerance, decreased balance, decreased mobility, difficulty walking, decreased ROM, decreased strength, increased fascial restrictions, impaired perceived functional ability, increased muscle spasms, impaired sensation, impaired tone, impaired UE functional use, improper body mechanics, postural dysfunction, and pain.   ACTIVITY LIMITATIONS: carrying, lifting, bending, standing, squatting, stairs, and hygiene/grooming  PARTICIPATION LIMITATIONS: driving and community activity  PERSONAL FACTORS: Time since onset of injury/illness/exacerbation and 1-2 comorbidities:    T5 paraplegia, B torn RTCs, and cervical stenosis with posterior fusion.are also affecting patient's functional outcome.   REHAB POTENTIAL: Fair time since onset of SCI, torn RTCs  CLINICAL DECISION MAKING: Stable/uncomplicated  EVALUATION COMPLEXITY: High  PLAN:  PT FREQUENCY: 1x/week  PT DURATION: 6 weeks + 10 visits (recert)  PLANNED INTERVENTIONS: Therapeutic exercises, Therapeutic activity, Neuromuscular re-education, Balance training, Gait training, Patient/Family education, Self Care, Joint mobilization, Dry Needling, Electrical stimulation, Wheelchair mobility training, Spinal manipulation, Spinal mobilization, Cryotherapy, Moist heat, Taping, Manual therapy, and Re-evaluation  PLAN FOR NEXT SESSION: schedule more visits? What did Dr. Berline Chough say about standing frame? x 10 min then needle hips, work on shoulder and periscapular strengthening, how is HEP? Progress towards STOMPS as able,  TPDN, piriformis stretch, SKTC, LTR, prone press-ups? IYT in sitting or prone?    Peter Congo, PT, DPT, CSRS  07/31/2023, 3:48 PM

## 2023-08-01 ENCOUNTER — Encounter: Payer: Self-pay | Admitting: Physical Medicine and Rehabilitation

## 2023-08-01 ENCOUNTER — Encounter: Payer: 59 | Attending: Physical Medicine and Rehabilitation | Admitting: Physical Medicine and Rehabilitation

## 2023-08-01 VITALS — BP 99/58 | HR 100 | Wt 161.6 lb

## 2023-08-01 DIAGNOSIS — G8220 Paraplegia, unspecified: Secondary | ICD-10-CM | POA: Insufficient documentation

## 2023-08-01 DIAGNOSIS — M7918 Myalgia, other site: Secondary | ICD-10-CM | POA: Insufficient documentation

## 2023-08-01 DIAGNOSIS — G8110 Spastic hemiplegia affecting unspecified side: Secondary | ICD-10-CM

## 2023-08-01 DIAGNOSIS — Z993 Dependence on wheelchair: Secondary | ICD-10-CM | POA: Insufficient documentation

## 2023-08-01 DIAGNOSIS — R252 Cramp and spasm: Secondary | ICD-10-CM | POA: Diagnosis not present

## 2023-08-01 MED ORDER — LIDOCAINE HCL 1 % IJ SOLN
9.0000 mL | Freq: Once | INTRAMUSCULAR | Status: AC
Start: 2023-08-01 — End: 2023-08-01
  Administered 2023-08-01: 9 mL

## 2023-08-01 NOTE — Patient Instructions (Signed)
Plan: Patient is paraplegic-  and needs another way to control spasticity- Needs to be able to get stander- due to needing another option to control spasticity and bowels- since very constipated- standers have been proven to reduce constipation with neurogenic bowels, reduce risk of contractures, that patient is very high risk for; and also an improved ability to control spasticity-    2. Patient here for trigger point injections for myofascia lpain  Consent done and on chart.  Cleaned areas with alcohol and injected using a 27 gauge 1.5 inch needle  Injected 9cc Using 1% Lidocaine with no EPI  Upper traps B/L - x2 Levators- B/L  Posterior scalenes Middle scalenes- B/L x2 Splenius Capitus- B/L x2 Pectoralis Major- B/L  Rhomboids B?l x3 Infraspinatus Teres Major/minor Thoracic paraspinals b/L  Lumbar paraspinals b?Lx 2 Other injections- B/L triceps, forearms and hands- and     Patient's level of pain prior was 9/10 Current level of pain after injections is- 4-5/10  There was no bleeding or complications.  Patient was advised to drink a lot of water on day after injections to flush system Will have increased soreness for 12-48 hours after injections.  Can use Lidocaine patches the day AFTER injections Can use theracane on day of injections in places didn't inject Can use heating pad 4-6 hours AFTER injections  3. did myofascial release on B/L palms- and hands for pain control and demonstrated to pt how to do.    4. Went over therapies and plans to work on things with PT-   5. Went over SCI support group.  6. F/U in 1 month- for injections

## 2023-08-01 NOTE — Progress Notes (Signed)
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  Also has unusual sensations/spasms-. Here for f/u on SCI. With newly dx'd Anterocollis greater than torticollis-  L 4th digit DIP tendon rupture B/L carpal tunnel syndrome L ulnar compressive neuropathy. Got approved for power assist w/c- will get in next month Here for fu on paraplegia and associated pain issues and trigger point injections.    Had a bad fall-   Floor is uneven at home- tile and hardwood- rolled forward and hit dining room table- and fell backward and hit head.   Busted head open.    Foot xrays ok- stood for 10 minutes with Ladona Ridgel.  Got pressure mapped yesterday - looked great.  Got w/c last year-   Plan: Patient is paraplegic-  and needs another way to control spasticity- Needs to be able to get stander- due to needing another option to control spasticity and bowels- since very constipated- standers have been proven to reduce constipation with neurogenic bowels, reduce risk of contractures, that patient is very high risk for; and also an improved ability to control spasticity-    2. Patient here for trigger point injections for myofascia lpain  Consent done and on chart.  Cleaned areas with alcohol and injected using a 27 gauge 1.5 inch needle  Injected 9cc Using 1% Lidocaine with no EPI  Upper traps B/L - x2 Levators- B/L  Posterior scalenes Middle scalenes- B/L x2 Splenius Capitus- B/L x2 Pectoralis Major- B/L  Rhomboids B?l x3 Infraspinatus Teres Major/minor Thoracic paraspinals b/L  Lumbar paraspinals b?Lx 2 Other injections- B/L triceps, forearms and hands- and     Patient's level of pain prior was 9/10 Current level of pain after injections is- 4-5/10  There was no bleeding or complications.  Patient was advised to drink a lot  of water on day after injections to flush system Will have increased soreness for 12-48 hours after injections.  Can use Lidocaine patches the day AFTER injections Can use theracane on day of injections in places didn't inject Can use heating pad 4-6 hours AFTER injections  3. did myofascial release on B/L palms- and hands for pain control and demonstrated to pt how to do.    4. Went over therapies and plans to work on things with PT-   5. Went over SCI support group.  6. F/U in 1 month- for injections  7. Went over ways to avoid falls.     I spent a total of 32   minutes on total care today- >50% coordination of care- due to d/w pt about stander; and myofascial release- and and how ot avoid falls.

## 2023-08-05 ENCOUNTER — Ambulatory Visit: Payer: 59 | Admitting: Physical Therapy

## 2023-08-05 DIAGNOSIS — G8929 Other chronic pain: Secondary | ICD-10-CM

## 2023-08-05 DIAGNOSIS — M5459 Other low back pain: Secondary | ICD-10-CM

## 2023-08-05 DIAGNOSIS — M6281 Muscle weakness (generalized): Secondary | ICD-10-CM | POA: Diagnosis not present

## 2023-08-05 DIAGNOSIS — M546 Pain in thoracic spine: Secondary | ICD-10-CM

## 2023-08-05 DIAGNOSIS — M25551 Pain in right hip: Secondary | ICD-10-CM

## 2023-08-05 NOTE — Therapy (Signed)
OUTPATIENT PHYSICAL THERAPY NEURO TREATMENT   Patient Name: Devin Collins MRN: [id number] DOB:07/17/1981, 42 y.o., male Today's Date: 08/05/2023   PCP: Dorian Heckle, MD (per chart) REFERRING PROVIDER: Genice Rouge, MD  END OF SESSION:  PT End of Session - 08/05/23 1324     Visit Number 9    Number of Visits 17   recert   Date for PT Re-Evaluation 10/14/23   recert, to allow for scheduling delays   Authorization Type [employer] Medicare    Progress Note Due on Visit 10    PT Start Time 1323   pt arrived late   PT Stop Time 1402    PT Time Calculation (min) 39 min    Activity Tolerance Patient tolerated treatment well    Behavior During Therapy [facility] for tasks assessed/performed                     Past Medical History:  Diagnosis Date   Arthritis    Decubitus ulcer of sacral region, stage 4 (HCC) 10/03/2017   Post-traumatic paraplegia 2004   Scoliosis    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[facility])    Past Surgical History:  Procedure Laterality Date   COSMETIC SURGERY  10/05/2018   flap closure sacral wound   IVC FILTER INSERTION     posterior lumbar laminectomy and fusion  12/03/2018   SPINE SURGERY  11/09/2018   lumbar fusion    THORACIC FUSION     T10-11   THORACIC LAMINECTOMY  08/2005   Patient Active Problem List   Diagnosis Date Noted   Ulnar neuropathy at elbow, left 10/31/2021   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   Torticollis, acquired 07/30/2021   Post-traumatic paraplegia 06/05/2021   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   Hip pain, bilateral 01/05/2021   Vitamin D deficiency 01/05/2021   Myofascial pain 01/05/2021   Loss of balance 11/17/2020   Neurogenic bladder 11/17/2020   Wheelchair dependence 07/21/2020   Paraplegia (HCC) 05/22/2020   Spasticity 05/22/2020    ONSET DATE: 05/07/2023 (referral date)  REFERRING DIAG: G82.20 (ICD-10-CM) - Paraplegia (HCC) M79.18 (ICD-10-CM) - Myofascial pain Z99.3 (ICD-10-CM) - Wheelchair  dependence  THERAPY DIAG:  Muscle weakness (generalized)  Chronic pain of both shoulders  Pain in thoracic spine  Pain of both hip joints  Other low back pain  Rationale for Evaluation and Treatment: Rehabilitation  SUBJECTIVE:                                                                                                                                                                                             SUBJECTIVE  STATEMENT: Pt feeling "swimmy" headed today due to wearing new glasses. Pt did see Dr. Berline Chough last week, had trigger point injections which helped for that day. Looking into getting a standing frame.  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     PAIN:  Are you having pain? Yes: NPRS scale: 5/10 Pain location: everywhere Pain description: "popping" Aggravating factors: laying prone, any movement Relieving factors: N/A  Pt also states from an aggravation point of view pain is 12/10  PRECAUTIONS: Fall  RED FLAGS: None   WEIGHT BEARING RESTRICTIONS: No  FALLS: Has patient fallen in last 6 months? Yes. Number of falls unknown, one witnessed fall by this therapist that needed total A to return to w/c from the ground  LIVING ENVIRONMENT: Lives with: lives with their family Lives in: House/apartment Home is accessible to patient  PLOF: Independent with basic ADLs, Independent with household mobility with device, Independent with community mobility with device, Independent with transfers, and Requires assistive device for independence  PATIENT GOALS: "to maintain my independence"  OBJECTIVE:   DIAGNOSTIC FINDINGS:  See chart for more extensive review.  MRI PELVIS W AND WO CONTRAST, 04/18/2023 5:59 PM  IMPRESSION: CONCLUSION: 1.  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 No evidence of acute osteomyelitis. 2. Interval slight decrease in size of the right buttock mass as above, favored large adventitial bursitis. Interval increased fluid component in lateral aspect of the bursa.  COGNITION: Overall cognitive status: Within functional limits for tasks assessed   SENSATION: T5 paraplegic   POSTURE: rounded shoulders, forward head, increased thoracic kyphosis, posterior pelvic tilt, and flexed trunk   LOWER EXTREMITY ROM:     Passive  Right Eval Left Eval  Hip flexion [facility]  Hip extension    Hip abduction [facility]  Hip adduction    Hip internal rotation    Hip external rotation    Knee flexion [facility]  Knee extension [facility]  Ankle dorsiflexion [facility]  Ankle plantarflexion [facility] WFL  Ankle inversion [facility] WFL  Ankle eversion WFL WFL   (Blank rows = not tested)   Of note: patient does exhibit PROM of BLE WFL in all available planes, however when placed in Modified Thomas stretch position he has onset of B hip flexor spasticity/tightness preventing full ROM.  LOWER EXTREMITY MMT:    MMT Right Eval Left Eval  Hip flexion    Hip extension    Hip abduction    Hip adduction    Hip internal rotation    Hip external rotation    Knee flexion    Knee extension    Ankle dorsiflexion    Ankle plantarflexion    Ankle inversion    Ankle eversion    (Blank rows = not tested)   BLE WFL in supine but when in Modified Thomas position has onset of spasticity in hips Want to try standing frame but need to check bone density first  Not comfortable in prone, has onset of hip spasticity/tightness as well as all around back  Pain jumps from side to side  On several meds for spasticity L side of trunk is weaker  BED MOBILITY:  Mod I  TRANSFERS: Assistive device utilized: Wheelchair (manual)  Sit to stand:  unable (T5 para) Stand  to sit:  unable (T5 para) Chair to chair: Modified independence Floor: Total A   TODAY'S TREATMENT:                                                                                                                               TherEx Session focus on B shoulder strengthening for improved postural control and pain management: Seated resisted shoulder adduction with RTB x 10 reps B Slightly easier with LUE as compared to RUE Attempted seated resisted ER, unable to get full ROM in sitting Attempt seated ER stretch with dowel rod, unable to maintain sitting balance in manual w/c due to impaired core stability  Transitioned to supine position for improved stability with shoulder therex Supine R shoulder ER PROM and stretch with 5# dumbbell x 5 reps B Supine B shoulder resisted ER with red theraband  Added supine B shoulder ER stretch, supine B shoulder resisted ER with RTB, and seated B shoulder resisted adduction with RTB to HEP, see bolded below   PATIENT EDUCATION: Education details: continue HEP, added to HEP Person educated: Patient Education method: Explanation, Demonstration, Tactile cues, Verbal cues, and Handouts Education comprehension: verbalized understanding and returned demonstration  HOME EXERCISE PROGRAM: Access Code: [email or web address] URL: [email or web address] Date: 06/04/2023 Prepared by: Peter Congo  Exercises - Supine Shoulder External Rotation Stretch  - 1 x daily - 7 x weekly - 3 sets - 10 reps - Seated Scapular Retraction  - 1 x daily - 7 x weekly - 3 sets - 10 reps - Supine Bilateral Shoulder Protraction  - 1 x daily - 7 x weekly - 3 sets - 10 reps - Seated Shoulder Scaption AROM  - 1 x daily - 7 x weekly - 3 sets - 10 reps - Supine Thoracic Mobilization Towel Roll Vertical with Arm Stretch  - 1 x daily - 7 x weekly - 3 sets - 10 reps - Prone Hip Extension with Bent Knee - One Pillow  - 1 x daily - 7 x weekly - 1 sets - 1 reps - 10 min hold -  Modified Thomas Stretch  - 1 x daily - 7 x weekly - 1 sets - 5 reps - 30 sec hold - Supine Suboccipital Release with Tennis Balls  - 1 x daily - 7 x weekly - 1 sets - 1 reps - 5-10 min hold  - Supine Bilateral Shoulder External Rotation with Resistance around Wrists  - 1 x daily - 7 x weekly - 3 sets - 10 reps - Shoulder Adduction Unilateral Resistance  - 1 x daily - 7 x weekly - 3 sets - 10 reps   GOALS: Goals reviewed with patient? Yes   NEW SHORT TERM GOALS:   Target date: 08/19/2023  Pt to trial standing frame and tolerate standing x 15 min with no adverse affects Baseline: 10 min (10/9) Goal status: INITIAL   NEW LONG  TERM GOALS:  Target date: 09/16/2023  Pt will be independent with final HEP for improved strength, balance, transfers and ability to complete ADLs independently. Baseline: initial HEP established Goal status: IN PROGRESS  2.  Pt to improve score on the SPADI to 20% disability to demonstrate decreased disability level. Baseline: 30% (9/17), 38% disability (10/15) Goal status: IN PROGRESS  3.  Pt will tolerate standing in standing frame x 30 min to work on weight-bearing through BLE to improve bone health, spasticity management, improve circulation, improve skin health and decrease risk for pressure injuries, improve bowel and bladder function, and decrease risk for contractures.  Baseline: 10 min (10/9) Goal status: INITIAL    ASSESSMENT:  CLINICAL IMPRESSION: Emphasis of skilled PT session on adding B shoulder strengthening and stretching exercises to HEP. Pt exhibits decreased R shoulder ER ROM as compared to L shoulder, does improve with PROM and stretch with 5# dumbbell. Pt continues to exhibit B shoulder weakness and impaired ROM leading to impaired function as he is dependent on his BUE for all mobility at a w/c level. Pt continues to benefit from skilled therapy services to work towards LTGs. Continue POC.    OBJECTIVE IMPAIRMENTS: decreased activity  tolerance, decreased balance, decreased mobility, difficulty walking, decreased ROM, decreased strength, increased fascial restrictions, impaired perceived functional ability, increased muscle spasms, impaired sensation, impaired tone, impaired UE functional use, improper body mechanics, postural dysfunction, and pain.   ACTIVITY LIMITATIONS: carrying, lifting, bending, standing, squatting, stairs, and hygiene/grooming  PARTICIPATION LIMITATIONS: driving and community activity  PERSONAL FACTORS: Time since onset of injury/illness/exacerbation and 1-2 comorbidities:    T5 paraplegia, B torn RTCs, and cervical stenosis with posterior fusion.are also affecting patient's functional outcome.   REHAB POTENTIAL: Fair time since onset of SCI, torn RTCs  CLINICAL DECISION MAKING: Stable/uncomplicated  EVALUATION COMPLEXITY: High  PLAN:  PT FREQUENCY: 1x/week  PT DURATION: 6 weeks + 10 visits (recert)  PLANNED INTERVENTIONS: Therapeutic exercises, Therapeutic activity, Neuromuscular re-education, Balance training, Gait training, Patient/Family education, Self Care, Joint mobilization, Dry Needling, Electrical stimulation, Wheelchair mobility training, Spinal manipulation, Spinal mobilization, Cryotherapy, Moist heat, Taping, Manual therapy, and Re-evaluation  PLAN FOR NEXT SESSION: schedule more visits, What did Barbara Cower say about standing frame? x 10-15 min then needle hips, work on shoulder and periscapular strengthening, how is HEP? Progress towards STOMPS as able,  TPDN, piriformis stretch, SKTC, LTR, prone press-ups? IYT in sitting or prone?    Peter Congo, PT, DPT, CSRS  08/05/2023, 2:05 PM

## 2023-08-12 ENCOUNTER — Ambulatory Visit: Payer: 59 | Attending: Family Medicine | Admitting: Physical Therapy

## 2023-08-12 DIAGNOSIS — M6281 Muscle weakness (generalized): Secondary | ICD-10-CM | POA: Insufficient documentation

## 2023-08-12 DIAGNOSIS — M25512 Pain in left shoulder: Secondary | ICD-10-CM | POA: Diagnosis present

## 2023-08-12 DIAGNOSIS — M25552 Pain in left hip: Secondary | ICD-10-CM | POA: Insufficient documentation

## 2023-08-12 DIAGNOSIS — M25511 Pain in right shoulder: Secondary | ICD-10-CM | POA: Insufficient documentation

## 2023-08-12 DIAGNOSIS — M5459 Other low back pain: Secondary | ICD-10-CM | POA: Diagnosis present

## 2023-08-12 DIAGNOSIS — M25551 Pain in right hip: Secondary | ICD-10-CM | POA: Diagnosis present

## 2023-08-12 DIAGNOSIS — G8929 Other chronic pain: Secondary | ICD-10-CM | POA: Insufficient documentation

## 2023-08-12 DIAGNOSIS — M546 Pain in thoracic spine: Secondary | ICD-10-CM | POA: Diagnosis present

## 2023-08-12 NOTE — Therapy (Signed)
OUTPATIENT PHYSICAL THERAPY NEURO TREATMENT - 10th VISIT PROGRESS NOTE   Patient Name: Devin Collins MRN: [id number] DOB:March 21, 1981, 42 y.o., male Today's Date: 08/12/2023   PCP: Dorian Heckle, MD (per chart) REFERRING PROVIDER: Genice Rouge, MD  Physical Therapy Progress Note   Dates of Reporting Period: 05/30/2023 - 08/12/2023  See Note below for Objective Data and Assessment of Progress/Goals.  Thank you for the referral of this patient. Peter Congo, PT, DPT, CSRS   END OF SESSION:  PT End of Session - 08/12/23 1317     Visit Number 10    Number of Visits 17   recert   Date for PT Re-Evaluation 10/14/23   recert, to allow for scheduling delays   Authorization Type [employer] Medicare    Progress Note Due on Visit 10    PT Start Time 1315    PT Stop Time 1400    PT Time Calculation (min) 45 min    Activity Tolerance Patient tolerated treatment well    Behavior During Therapy [facility] for tasks assessed/performed                      Past Medical History:  Diagnosis Date   Arthritis    Decubitus ulcer of sacral region, stage 4 (HCC) 10/03/2017   Post-traumatic paraplegia 2004   Scoliosis    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[facility])    Past Surgical History:  Procedure Laterality Date   COSMETIC SURGERY  10/05/2018   flap closure sacral wound   IVC FILTER INSERTION     posterior lumbar laminectomy and fusion  12/03/2018   SPINE SURGERY  11/09/2018   lumbar fusion    THORACIC FUSION     T10-11   THORACIC LAMINECTOMY  08/2005   Patient Active Problem List   Diagnosis Date Noted   Ulnar neuropathy at elbow, left 10/31/2021   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   Torticollis, acquired 07/30/2021   Post-traumatic paraplegia 06/05/2021   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   Hip pain, bilateral 01/05/2021   Vitamin D deficiency 01/05/2021   Myofascial pain 01/05/2021   Loss of balance 11/17/2020   Neurogenic bladder 11/17/2020   Wheelchair  dependence 07/21/2020   Paraplegia (HCC) 05/22/2020   Spasticity 05/22/2020    ONSET DATE: 05/07/2023 (referral date)  REFERRING DIAG: G82.20 (ICD-10-CM) - Paraplegia (HCC) M79.18 (ICD-10-CM) - Myofascial pain Z99.3 (ICD-10-CM) - Wheelchair dependence  THERAPY DIAG:  Muscle weakness (generalized)  Chronic pain of both shoulders  Pain in thoracic spine  Pain of both hip joints  Other low back pain  Rationale for Evaluation and Treatment: Rehabilitation  SUBJECTIVE:  SUBJECTIVE STATEMENT: Pt reports he has been doing stretching at home, this has lead to pulling of the muscles along the back of his neck to the top of his head and to his eyebrows and feels like a "hook" in the back of his neck. Pt also reports tightness in his abdominal muscles today. Pt agreeable to use a PT session to meet with [employer] regarding a standing frame.  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     PAIN:  Are you having pain? Yes: NPRS scale: 5/10 Pain location: everywhere Pain description: "popping" Aggravating factors: laying prone, any movement Relieving factors: N/A  Pt also states from an aggravation point of view pain is 12/10  PRECAUTIONS: Fall  RED FLAGS: None   WEIGHT BEARING RESTRICTIONS: No  FALLS: Has patient fallen in last 6 months? Yes. Number of falls unknown, one witnessed fall by this therapist that needed total A to return to w/c from the ground  LIVING ENVIRONMENT: Lives with: lives with their family Lives in: House/apartment Home is accessible to patient  PLOF:  Independent with basic ADLs, Independent with household mobility with device, Independent with community mobility with device, Independent with transfers, and Requires assistive device for independence  PATIENT GOALS: "to maintain my independence"  OBJECTIVE:   DIAGNOSTIC FINDINGS:  See chart for more extensive review.  MRI PELVIS W AND WO CONTRAST, 04/18/2023 5:59 PM  IMPRESSION: CONCLUSION: 1. 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 No evidence of acute osteomyelitis. 2. Interval slight decrease in size of the right buttock mass as above, favored large adventitial bursitis. Interval increased fluid component in lateral aspect of the bursa.  COGNITION: Overall cognitive status: Within functional limits for tasks assessed   SENSATION: T5 paraplegic   POSTURE: rounded shoulders, forward head, increased thoracic kyphosis, posterior pelvic tilt, and flexed trunk   LOWER EXTREMITY ROM:     Passive  Right Eval Left Eval  Hip flexion [facility]  Hip extension    Hip abduction [facility]  Hip adduction    Hip internal rotation    Hip external rotation    Knee flexion [facility]  Knee extension [facility]  Ankle dorsiflexion [facility]  Ankle plantarflexion [facility] WFL  Ankle inversion [facility] WFL  Ankle eversion WFL WFL   (Blank rows = not tested)   Of note: patient does exhibit PROM of BLE WFL in all available planes, however when placed in Modified Thomas stretch position he has onset of B hip flexor spasticity/tightness preventing full ROM.  LOWER EXTREMITY MMT:    MMT Right Eval Left Eval  Hip flexion    Hip extension    Hip abduction    Hip adduction    Hip internal rotation    Hip external rotation    Knee flexion    Knee extension    Ankle dorsiflexion    Ankle plantarflexion    Ankle inversion    Ankle eversion    (Blank rows = not tested)   BLE WFL in supine but when in Modified Thomas position  has onset of spasticity in hips Want to try standing frame but need to check bone density first  Not comfortable in prone, has onset of hip spasticity/tightness as  well as all around back  Pain jumps from side to side  On several meds for spasticity L side of trunk is weaker  BED MOBILITY:  Mod I  TRANSFERS: Assistive device utilized: Wheelchair (manual)  Sit to stand:  unable (T5 para) Stand to sit:  unable (T5 para) Chair to chair: Modified independence Floor: Total A   TODAY'S TREATMENT:                                                                                                                               NMR Session focus on use of standing frame for weight-bearing through BLE to improve bone health, spasticity management, improve circulation, improve skin health and decrease risk for pressure injuries, improve bowel and bladder function, and decrease risk for contractures. Pt performs squat pivot transfers w/c to/from mat table at mod I level. No signs/symptoms of OH during standing so BP not assessed this session. Sit to stand dependently with use of standing frame sling. Pt able to tolerate standing x 15 min. During standing pt able to performing alt L/R UE volleyball hits with one UE on tray of standing frame for upright support. Pt exhibits L lateral lean and weight shift to the L when only holding tray with LUE, no LOB noted when holding tray with RUE. With onset of fatigue pt also exhibits increased lateral lean to the R.    TherAct Trigger Point Dry-Needling  Treatment instructions: Expect mild to moderate muscle soreness. S/S of pneumothorax if dry needled over a lung field, and to seek immediate medical attention should they occur. Patient verbalized understanding of these instructions and education.  Patient Consent Given: Yes Education handout provided: Previously provided Muscles treated: L and R suboccipitals Treatment response/outcome: deep ache/muscle cramp     PATIENT EDUCATION: Education details: continue HEP, PT POC with added sessions Person educated: Patient Education method: Explanation, Demonstration, Tactile cues, and Verbal cues Education comprehension: verbalized understanding and returned demonstration  HOME EXERCISE PROGRAM: Access Code: [email or web address] URL: [email or web address] Date: 06/04/2023 Prepared by: Peter Congo  Exercises - Supine Shoulder External Rotation Stretch  - 1 x daily - 7 x weekly - 3 sets - 10 reps - Seated Scapular Retraction  - 1 x daily - 7 x weekly - 3 sets - 10 reps - Supine Bilateral Shoulder Protraction  - 1 x daily - 7 x weekly - 3 sets - 10 reps - Seated Shoulder Scaption AROM  - 1 x daily - 7 x weekly - 3 sets - 10 reps - Supine Thoracic Mobilization Towel Roll Vertical with Arm Stretch  - 1 x daily - 7 x weekly - 3 sets - 10 reps - Prone Hip Extension with Bent Knee - One Pillow  - 1 x daily - 7 x weekly - 1 sets - 1 reps - 10 min hold - Modified Thomas Stretch  - 1 x daily - 7 x weekly - 1 sets - 5 reps -  30 sec hold - Supine Suboccipital Release with Tennis Balls  - 1 x daily - 7 x weekly - 1 sets - 1 reps - 5-10 min hold - Supine Bilateral Shoulder External Rotation with Resistance around Wrists  - 1 x daily - 7 x weekly - 3 sets - 10 reps - Shoulder Adduction Unilateral Resistance  - 1 x daily - 7 x weekly - 3 sets - 10 reps   GOALS: Goals reviewed with patient? Yes   NEW SHORT TERM GOALS:   Target date: 08/19/2023  Pt to trial standing frame and tolerate standing x 15 min with no adverse affects Baseline: 10 min (10/9), 15 min (11/5) Goal status: MET   NEW LONG TERM GOALS:  Target date: 09/16/2023  Pt will be independent with final HEP for improved strength, balance, transfers and ability to complete ADLs independently. Baseline: initial HEP established Goal status: IN PROGRESS  2.  Pt to improve score on the SPADI to 20% disability to demonstrate decreased disability  level. Baseline: 30% (9/17), 38% disability (10/15) Goal status: IN PROGRESS  3.  Pt will tolerate standing in standing frame x 30 min to work on weight-bearing through BLE to improve bone health, spasticity management, improve circulation, improve skin health and decrease risk for pressure injuries, improve bowel and bladder function, and decrease risk for contractures.  Baseline: 10 min (10/9), 15 min (11/5) Goal status: INITIAL    ASSESSMENT:  CLINICAL IMPRESSION: Emphasis of skilled PT session on continuing to work on standing tolerance and benefits of standing with use of standing frame as noted above, performing TPDN to address ongoing pain in cervical muscles, and discussing PT POC. Pt exhibits improved tolerance for standing this date with progression to ability to stand x 15 min while performing alt UE tasks, no adverse reactions to standing. Pt with some relief of pain/symptoms following DN. Pt agreeable to add visits within POC to review his final HEP, working on standing frame assessment, and continuing to work on increasing his standing tolerance time. Continue POC.    OBJECTIVE IMPAIRMENTS: decreased activity tolerance, decreased balance, decreased mobility, difficulty walking, decreased ROM, decreased strength, increased fascial restrictions, impaired perceived functional ability, increased muscle spasms, impaired sensation, impaired tone, impaired UE functional use, improper body mechanics, postural dysfunction, and pain.   ACTIVITY LIMITATIONS: carrying, lifting, bending, standing, squatting, stairs, and hygiene/grooming  PARTICIPATION LIMITATIONS: driving and community activity  PERSONAL FACTORS: Time since onset of injury/illness/exacerbation and 1-2 comorbidities:    T5 paraplegia, B torn RTCs, and cervical stenosis with posterior fusion.are also affecting patient's functional outcome.   REHAB POTENTIAL: Fair time since onset of SCI, torn RTCs  CLINICAL DECISION MAKING:  Stable/uncomplicated  EVALUATION COMPLEXITY: High  PLAN:  PT FREQUENCY: 1x/week  PT DURATION: 6 weeks + 10 visits (recert)  PLANNED INTERVENTIONS: Therapeutic exercises, Therapeutic activity, Neuromuscular re-education, Balance training, Gait training, Patient/Family education, Self Care, Joint mobilization, Dry Needling, Electrical stimulation, Wheelchair mobility training, Spinal manipulation, Spinal mobilization, Cryotherapy, Moist heat, Taping, Manual therapy, and Re-evaluation  PLAN FOR NEXT SESSION: review HEP, standing frame (work up to 30 min), standing frame assessment with Latina Craver, PT, DPT, CSRS  08/12/2023, 2:06 PM

## 2023-08-15 ENCOUNTER — Ambulatory Visit: Payer: 59 | Admitting: Physical Medicine and Rehabilitation

## 2023-08-20 ENCOUNTER — Ambulatory Visit: Payer: 59 | Admitting: Physical Therapy

## 2023-08-20 DIAGNOSIS — M6281 Muscle weakness (generalized): Secondary | ICD-10-CM

## 2023-08-20 DIAGNOSIS — M25551 Pain in right hip: Secondary | ICD-10-CM

## 2023-08-20 DIAGNOSIS — M5459 Other low back pain: Secondary | ICD-10-CM

## 2023-08-20 DIAGNOSIS — M546 Pain in thoracic spine: Secondary | ICD-10-CM

## 2023-08-20 DIAGNOSIS — G8929 Other chronic pain: Secondary | ICD-10-CM

## 2023-08-20 NOTE — Therapy (Signed)
OUTPATIENT PHYSICAL THERAPY NEURO TREATMENT   Patient Name: Devin Collins MRN: [id number] DOB:[birth year], 42 y.o., male Today's Date: 08/20/2023   PCP: Dorian Heckle, MD (per chart) REFERRING PROVIDER: Genice Rouge, MD    END OF SESSION:  PT End of Session - 08/20/23 1320     Visit Number 11    Number of Visits 17   recert   Date for PT Re-Evaluation 10/14/23   recert, to allow for scheduling delays   Authorization Type [employer] Medicare    Progress Note Due on Visit 10    PT Start Time 1320   pt arrived late   PT Stop Time 1400    PT Time Calculation (min) 40 min    Activity Tolerance Patient tolerated treatment well    Behavior During Therapy [facility] for tasks assessed/performed                       Past Medical History:  Diagnosis Date   Arthritis    Decubitus ulcer of sacral region, stage 4 (HCC) 10/03/2017   Post-traumatic paraplegia 2004   Scoliosis    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[facility])    Past Surgical History:  Procedure Laterality Date   COSMETIC SURGERY  10/05/2018   flap closure sacral wound   IVC FILTER INSERTION     posterior lumbar laminectomy and fusion  12/03/2018   SPINE SURGERY  11/09/2018   lumbar fusion    THORACIC FUSION     T10-11   THORACIC LAMINECTOMY  08/2005   Patient Active Problem List   Diagnosis Date Noted   Ulnar neuropathy at elbow, left 10/31/2021   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   Torticollis, acquired 07/30/2021   Post-traumatic paraplegia 06/05/2021   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   Hip pain, bilateral 01/05/2021   Vitamin D deficiency 01/05/2021   Myofascial pain 01/05/2021   Loss of balance 11/17/2020   Neurogenic bladder 11/17/2020   Wheelchair dependence 07/21/2020   Paraplegia (HCC) 05/22/2020   Spasticity 05/22/2020    ONSET DATE: 05/07/2023 (referral date)  REFERRING DIAG: G82.20 (ICD-10-CM) - Paraplegia (HCC) M79.18 (ICD-10-CM) - Myofascial pain Z99.3 (ICD-10-CM) - Wheelchair  dependence  THERAPY DIAG:  Muscle weakness (generalized)  Chronic pain of both shoulders  Pain in thoracic spine  Other low back pain  Pain of both hip joints  Rationale for Evaluation and Treatment: Rehabilitation  SUBJECTIVE:  SUBJECTIVE STATEMENT: Pt reports over the past week having spasms in his LLE that shoot into his back in a "c shape". Pt reports his muscles "jumping" and "wrapping" under each other. Pt reports that if he pops the tightness the muscles will start jumping again.  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     PAIN:  Are you having pain? Yes: NPRS scale: 5/10 Pain location: everywhere Pain description: "popping" Aggravating factors: laying prone, any movement Relieving factors: N/A  Pt also states from an aggravation point of view pain is 12/10  PRECAUTIONS: Fall  RED FLAGS: None   WEIGHT BEARING RESTRICTIONS: No  FALLS: Has patient fallen in last 6 months? Yes. Number of falls unknown, one witnessed fall by this therapist that needed total A to return to w/c from the ground  LIVING ENVIRONMENT: Lives with: lives with their family Lives in: House/apartment Home is accessible to patient  PLOF: Independent with basic ADLs, Independent with household mobility with device, Independent with community mobility with device, Independent with transfers, and Requires assistive device for independence  PATIENT GOALS: "to maintain my independence"  OBJECTIVE:   DIAGNOSTIC FINDINGS:  See chart for more extensive review.  MRI PELVIS W AND WO CONTRAST,  04/18/2023 5:59 PM  IMPRESSION: CONCLUSION: 1. 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 No evidence of acute osteomyelitis. 2. Interval slight decrease in size of the right buttock mass as above, favored large adventitial bursitis. Interval increased fluid component in lateral aspect of the bursa.  COGNITION: Overall cognitive status: Within functional limits for tasks assessed   SENSATION: T5 paraplegic   POSTURE: rounded shoulders, forward head, increased thoracic kyphosis, posterior pelvic tilt, and flexed trunk   LOWER EXTREMITY ROM:     Passive  Right Eval Left Eval  Hip flexion [facility]  Hip extension    Hip abduction [facility]  Hip adduction    Hip internal rotation    Hip external rotation    Knee flexion [facility]  Knee extension [facility]  Ankle dorsiflexion [facility]  Ankle plantarflexion [facility] WFL  Ankle inversion [facility] WFL  Ankle eversion WFL WFL   (Blank rows = not tested)   Of note: patient does exhibit PROM of BLE WFL in all available planes, however when placed in Modified Thomas stretch position he has onset of B hip flexor spasticity/tightness preventing full ROM.  LOWER EXTREMITY MMT:    MMT Right Eval Left Eval  Hip flexion    Hip extension    Hip abduction    Hip adduction    Hip internal rotation    Hip external rotation    Knee flexion    Knee extension    Ankle dorsiflexion    Ankle plantarflexion    Ankle inversion    Ankle eversion    (Blank rows = not tested)   BLE WFL in supine but when in Modified Thomas position has onset of spasticity in hips Want to try standing frame but need to check bone density first  Not comfortable in prone, has onset of hip spasticity/tightness as well as all around back  Pain jumps from side to side  On several meds for spasticity L side of trunk is weaker  BED MOBILITY:  Mod I  TRANSFERS: Assistive device utilized: Wheelchair  (manual)  Sit to stand:  unable (T5 para) Stand to sit:  unable (T5 para) Chair to chair: Modified independence Floor: Total A   TODAY'S TREATMENT:                                                                                                                                TherEx Supine BLE PROM in available planes of motion. Pt noted to have decreased L hip IR but increased ER as compared to his R hip which is the opposite. Pt feels a good stretch with B piriformis stretch.  Remainder of session focused on reviewing his current HEP and adding to HEP: Supine modified Thomas stretch 3 x 45 sec each Pt so tight that pushing his LE down into neutral rotates his opposite hip as well as his opposite trunk and shoulder Provided posteriorly directed pressure at thigh of LE and opposite hip to increase stretch Prone hip flexor stretch x 5 min Pt noted to remain in posterior pelvic tilt, unable to place pillows under thighs to increase stretch due to hip flexor tightness Supine thoracic mobilization stretch over towel roll X 10 reps B shoulder flexion X 10 reps B shoulder abduction X 10 reps B shoulder ER Seated anterior and anteriolateral leans on blue Swiss ball x 5 reps each direction with mod A needed at trunk for support Educated pt that he can perform this at home seated in his w/c leaning forwards onto his bed   PATIENT EDUCATION: Education details: continue HEP, added to HEP Person educated: Patient Education method: Programmer, multimedia, Demonstration, Tactile cues, Verbal cues, and Handouts Education comprehension: verbalized understanding and returned demonstration  HOME EXERCISE PROGRAM: Access Code: [email or web address] URL: [email or web address] Date: 06/04/2023 Prepared by: Peter Congo  Exercises - Supine Shoulder External Rotation Stretch  - 1 x daily - 7 x weekly - 3 sets - 10 reps - Seated Scapular Retraction  - 1 x daily - 7 x weekly - 3 sets - 10 reps - Supine  Bilateral Shoulder Protraction  - 1 x daily - 7 x weekly - 3 sets - 10 reps - Seated Shoulder Scaption AROM  - 1 x daily - 7 x weekly - 3 sets - 10 reps - Supine Thoracic Mobilization Towel Roll Vertical with Arm Stretch  - 1 x daily - 7 x weekly - 3 sets - 10 reps - Prone Hip Extension with Bent Knee - One Pillow  - 1 x daily - 7 x weekly - 1 sets - 1 reps - 10 min hold - Modified Thomas Stretch  - 1 x daily - 7 x weekly - 1 sets - 5 reps - 30 sec hold - Supine Suboccipital Release with Tennis Balls  - 1 x daily - 7 x weekly - 1 sets - 1 reps - 5-10 min hold - Supine Bilateral Shoulder External Rotation with Resistance around  Wrists  - 1 x daily - 7 x weekly - 3 sets - 10 reps - Shoulder Adduction Unilateral Resistance  - 1 x daily - 7 x weekly - 3 sets - 10 reps - Seated Flexion Stretch with Swiss Ball  - 1 x daily - 7 x weekly - 1 sets - 5 reps - 30 sec hold - Seated Thoracic Flexion and Rotation with Swiss Ball  - 1 x daily - 7 x weekly - 1 sets - 5 reps - 30 sec hold  Bolded = reviewed   GOALS: Goals reviewed with patient? Yes   NEW SHORT TERM GOALS:   Target date: 08/19/2023  Pt to trial standing frame and tolerate standing x 15 min with no adverse affects Baseline: 10 min (10/9), 15 min (11/5) Goal status: MET   NEW LONG TERM GOALS:  Target date: 09/16/2023  Pt will be independent with final HEP for improved strength, balance, transfers and ability to complete ADLs independently. Baseline: initial HEP established Goal status: IN PROGRESS  2.  Pt to improve score on the SPADI to 20% disability to demonstrate decreased disability level. Baseline: 30% (9/17), 38% disability (10/15) Goal status: IN PROGRESS  3.  Pt will tolerate standing in standing frame x 30 min to work on weight-bearing through BLE to improve bone health, spasticity management, improve circulation, improve skin health and decrease risk for pressure injuries, improve bowel and bladder function, and decrease  risk for contractures.  Baseline: 10 min (10/9), 15 min (11/5) Goal status: INITIAL    ASSESSMENT:  CLINICAL IMPRESSION: Emphasis of skilled PT session on assessing BLE ROM, performing stretching to address tight muscles this date, and reviewing/adding to HEP. Pt with decreased tightness following session with good review of HEP. Pt continues to benefit from skilled therapy services to review remainder of his HEP as well as to complete a standing frame evaluation. Continue POC.    OBJECTIVE IMPAIRMENTS: decreased activity tolerance, decreased balance, decreased mobility, difficulty walking, decreased ROM, decreased strength, increased fascial restrictions, impaired perceived functional ability, increased muscle spasms, impaired sensation, impaired tone, impaired UE functional use, improper body mechanics, postural dysfunction, and pain.   ACTIVITY LIMITATIONS: carrying, lifting, bending, standing, squatting, stairs, and hygiene/grooming  PARTICIPATION LIMITATIONS: driving and community activity  PERSONAL FACTORS: Time since onset of injury/illness/exacerbation and 1-2 comorbidities:    T5 paraplegia, B torn RTCs, and cervical stenosis with posterior fusion.are also affecting patient's functional outcome.   REHAB POTENTIAL: Fair time since onset of SCI, torn RTCs  CLINICAL DECISION MAKING: Stable/uncomplicated  EVALUATION COMPLEXITY: High  PLAN:  PT FREQUENCY: 1x/week  PT DURATION: 6 weeks + 10 visits (recert)  PLANNED INTERVENTIONS: Therapeutic exercises, Therapeutic activity, Neuromuscular re-education, Balance training, Gait training, Patient/Family education, Self Care, Joint mobilization, Dry Needling, Electrical stimulation, Wheelchair mobility training, Spinal manipulation, Spinal mobilization, Cryotherapy, Moist heat, Taping, Manual therapy, and Re-evaluation  PLAN FOR NEXT SESSION: finish review of HEP, standing frame (work up to 30 min), standing frame assessment with  Latina Craver, PT, DPT, CSRS  08/20/2023, 2:00 PM

## 2023-08-27 ENCOUNTER — Encounter: Payer: 59 | Attending: Physical Medicine and Rehabilitation | Admitting: Physical Medicine and Rehabilitation

## 2023-08-27 ENCOUNTER — Ambulatory Visit: Payer: 59 | Admitting: Physical Therapy

## 2023-08-27 ENCOUNTER — Encounter: Payer: Self-pay | Admitting: Physical Medicine and Rehabilitation

## 2023-08-27 VITALS — BP 105/63 | HR 92 | Ht 72.0 in | Wt 167.8 lb

## 2023-08-27 DIAGNOSIS — M25552 Pain in left hip: Secondary | ICD-10-CM

## 2023-08-27 DIAGNOSIS — M6281 Muscle weakness (generalized): Secondary | ICD-10-CM | POA: Diagnosis not present

## 2023-08-27 DIAGNOSIS — M5459 Other low back pain: Secondary | ICD-10-CM

## 2023-08-27 DIAGNOSIS — G8220 Paraplegia, unspecified: Secondary | ICD-10-CM | POA: Insufficient documentation

## 2023-08-27 DIAGNOSIS — Z993 Dependence on wheelchair: Secondary | ICD-10-CM | POA: Diagnosis not present

## 2023-08-27 DIAGNOSIS — M546 Pain in thoracic spine: Secondary | ICD-10-CM

## 2023-08-27 DIAGNOSIS — M7918 Myalgia, other site: Secondary | ICD-10-CM | POA: Insufficient documentation

## 2023-08-27 DIAGNOSIS — G8929 Other chronic pain: Secondary | ICD-10-CM

## 2023-08-27 MED ORDER — LIDOCAINE HCL 1 % IJ SOLN
9.0000 mL | Freq: Once | INTRAMUSCULAR | Status: AC
Start: 2023-08-27 — End: 2023-08-27
  Administered 2023-08-27: 9 mL

## 2023-08-27 NOTE — Therapy (Signed)
OUTPATIENT PHYSICAL THERAPY NEURO TREATMENT   Patient Name: Devin Collins MRN: [id number] DOB:Feb 13, 1981, 42 y.o., male Today's Date: 08/27/2023   PCP: Dorian Heckle, MD (per chart) REFERRING PROVIDER: Genice Rouge, MD    END OF SESSION:  PT End of Session - 08/27/23 1105     Visit Number 12    Number of Visits 17   recert   Date for PT Re-Evaluation 10/14/23   recert, to allow for scheduling delays   Authorization Type [employer] Medicare    Progress Note Due on Visit 10    PT Start Time 1105    PT Stop Time 1145    PT Time Calculation (min) 40 min    Activity Tolerance Patient tolerated treatment well    Behavior During Therapy [facility] for tasks assessed/performed                        Past Medical History:  Diagnosis Date   Arthritis    Decubitus ulcer of sacral region, stage 4 (HCC) 10/03/2017   Post-traumatic paraplegia 2004   Scoliosis    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[facility])    Past Surgical History:  Procedure Laterality Date   COSMETIC SURGERY  10/05/2018   flap closure sacral wound   IVC FILTER INSERTION     posterior lumbar laminectomy and fusion  12/03/2018   SPINE SURGERY  11/09/2018   lumbar fusion    THORACIC FUSION     T10-11   THORACIC LAMINECTOMY  08/2005   Patient Active Problem List   Diagnosis Date Noted   Ulnar neuropathy at elbow, left 10/31/2021   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   Torticollis, acquired 07/30/2021   Post-traumatic paraplegia 06/05/2021   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   Hip pain, bilateral 01/05/2021   Vitamin D deficiency 01/05/2021   Myofascial pain 01/05/2021   Loss of balance 11/17/2020   Neurogenic bladder 11/17/2020   Wheelchair dependence 07/21/2020   Paraplegia (HCC) 05/22/2020   Spasticity 05/22/2020    ONSET DATE: 05/07/2023 (referral date)  REFERRING DIAG: G82.20 (ICD-10-CM) - Paraplegia (HCC) M79.18 (ICD-10-CM) - Myofascial pain Z99.3 (ICD-10-CM) - Wheelchair  dependence  THERAPY DIAG:  Muscle weakness (generalized)  Chronic pain of both shoulders  Pain in thoracic spine  Other low back pain  Pain of both hip joints  Rationale for Evaluation and Treatment: Rehabilitation  SUBJECTIVE:  SUBJECTIVE STATEMENT: Pt reports he is feeling better today, had trigger point injections this morning with Dr. Berline Chough. Pt still having "rolling" sensations across his chest, back, hips, etc. He is wanting imaging if it doesn't get better.  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     PAIN:  Are you having pain? Yes: NPRS scale: 5/10 Pain location: everywhere Pain description: "popping" Aggravating factors: laying prone, any movement Relieving factors: N/A  Pt also states from an aggravation point of view pain is 12/10  PRECAUTIONS: Fall  RED FLAGS: None   WEIGHT BEARING RESTRICTIONS: No  FALLS: Has patient fallen in last 6 months? Yes. Number of falls unknown, one witnessed fall by this therapist that needed total A to return to w/c from the ground  LIVING ENVIRONMENT: Lives with: lives with their family Lives in: House/apartment Home is accessible to patient  PLOF: Independent with basic ADLs, Independent with household mobility with device, Independent with community mobility with device, Independent with transfers, and Requires assistive device for independence  PATIENT GOALS: "to maintain my independence"  OBJECTIVE:   DIAGNOSTIC FINDINGS:  See chart for more extensive review.  MRI PELVIS W AND WO CONTRAST, 04/18/2023 5:59  PM  IMPRESSION: CONCLUSION: 1. 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 No evidence of acute osteomyelitis. 2. Interval slight decrease in size of the right buttock mass as above, favored large adventitial bursitis. Interval increased fluid component in lateral aspect of the bursa.  COGNITION: Overall cognitive status: Within functional limits for tasks assessed   SENSATION: T5 paraplegic   POSTURE: rounded shoulders, forward head, increased thoracic kyphosis, posterior pelvic tilt, and flexed trunk   LOWER EXTREMITY ROM:     Passive  Right Eval Left Eval  Hip flexion [facility]  Hip extension    Hip abduction [facility]  Hip adduction    Hip internal rotation    Hip external rotation    Knee flexion [facility]  Knee extension [facility]  Ankle dorsiflexion [facility]  Ankle plantarflexion [facility] WFL  Ankle inversion [facility] WFL  Ankle eversion WFL WFL   (Blank rows = not tested)   Of note: patient does exhibit PROM of BLE WFL in all available planes, however when placed in Modified Thomas stretch position he has onset of B hip flexor spasticity/tightness preventing full ROM.  LOWER EXTREMITY MMT:    MMT Right Eval Left Eval  Hip flexion    Hip extension    Hip abduction    Hip adduction    Hip internal rotation    Hip external rotation    Knee flexion    Knee extension    Ankle dorsiflexion    Ankle plantarflexion    Ankle inversion    Ankle eversion    (Blank rows = not tested)   BLE WFL in supine but when in Modified Thomas position has onset of spasticity in hips Want to try standing frame but need to check bone density first  Not comfortable in prone, has onset of hip spasticity/tightness as well as all around back  Pain jumps from side to side  On several meds for spasticity L side of trunk is weaker  BED MOBILITY:  Mod I  TRANSFERS: Assistive  device utilized: Wheelchair (manual)  Sit  to stand:  unable (T5 para) Stand to sit:  unable (T5 para) Chair to chair: Modified independence Floor: Total A   TODAY'S TREATMENT:                                                                                                                                TherEx Review of remainder of HEP not covered last session: Supine shoulder protraction x 10 reps B Verbally reviewed suboccipital release with tennis balls and provided patient with new tennis balls Seated scapular retraction x 10 reps B Pt able to perform with RUE while seated unsupported on mat table or in w/c if scooted to R side of seat of chair Pt unable to perform with LUE while seated on mat table due to poor core control, able to perform while seated in w/c if scooted to L side of seat of chair Seated shoulder scaption x 10 reps B Seated resisted shoulder adduction with use of RTB x 10 reps B  Provided patient with handout of HEP   PATIENT EDUCATION: Education details: continue HEP, PT POC with plan to perform standing frame assessment next session Person educated: Patient Education method: Explanation, Demonstration, Tactile cues, Verbal cues, and Handouts Education comprehension: verbalized understanding and returned demonstration  HOME EXERCISE PROGRAM: Access Code: [email or web address] URL: [email or web address] Date: 06/04/2023 Prepared by: Peter Congo  Exercises - Supine Shoulder External Rotation Stretch  - 1 x daily - 7 x weekly - 3 sets - 10 reps - Seated Scapular Retraction  - 1 x daily - 7 x weekly - 3 sets - 10 reps - Supine Bilateral Shoulder Protraction  - 1 x daily - 7 x weekly - 3 sets - 10 reps - Seated Shoulder Scaption AROM  - 1 x daily - 7 x weekly - 3 sets - 10 reps - Supine Thoracic Mobilization Towel Roll Vertical with Arm Stretch  - 1 x daily - 7 x weekly - 3 sets - 10 reps - Prone Hip Extension with Bent Knee - One Pillow  - 1 x daily - 7 x weekly - 1 sets - 1 reps - 10 min hold -  Modified Thomas Stretch  - 1 x daily - 7 x weekly - 1 sets - 5 reps - 30 sec hold - Supine Suboccipital Release with Tennis Balls  - 1 x daily - 7 x weekly - 1 sets - 1 reps - 5-10 min hold - Supine Bilateral Shoulder External Rotation with Resistance around Wrists  - 1 x daily - 7 x weekly - 3 sets - 10 reps - Shoulder Adduction Unilateral Resistance  - 1 x daily - 7 x weekly - 3 sets - 10 reps - Seated Flexion Stretch with Swiss Ball  - 1 x daily - 7 x weekly - 1 sets - 5 reps - 30 sec hold - Seated Thoracic Flexion and Rotation with [employer]  -  1 x daily - 7 x weekly - 1 sets - 5 reps - 30 sec hold   GOALS: Goals reviewed with patient? Yes   NEW SHORT TERM GOALS:   Target date: 08/19/2023  Pt to trial standing frame and tolerate standing x 15 min with no adverse affects Baseline: 10 min (10/9), 15 min (11/5) Goal status: MET   NEW LONG TERM GOALS:  Target date: 09/16/2023  Pt will be independent with final HEP for improved strength, balance, transfers and ability to complete ADLs independently. Baseline: initial HEP established, finalized review 11/20 Goal status: MET  2.  Pt to improve score on the SPADI to 20% disability to demonstrate decreased disability level. Baseline: 30% (9/17), 38% disability (10/15) Goal status: IN PROGRESS  3.  Pt will tolerate standing in standing frame x 30 min to work on weight-bearing through BLE to improve bone health, spasticity management, improve circulation, improve skin health and decrease risk for pressure injuries, improve bowel and bladder function, and decrease risk for contractures.  Baseline: 10 min (10/9), 15 min (11/5) Goal status: INITIAL    ASSESSMENT:  CLINICAL IMPRESSION: Emphasis of skilled PT session on finalizing review of his HEP. Pt with good understanding of how to perform exercises correctly and is able to perform return demonstration of all exercises. Pt continues to benefit from one final skilled therapy session to  complete a standing frame evaluation. Continue POC.   OBJECTIVE IMPAIRMENTS: decreased activity tolerance, decreased balance, decreased mobility, difficulty walking, decreased ROM, decreased strength, increased fascial restrictions, impaired perceived functional ability, increased muscle spasms, impaired sensation, impaired tone, impaired UE functional use, improper body mechanics, postural dysfunction, and pain.   ACTIVITY LIMITATIONS: carrying, lifting, bending, standing, squatting, stairs, and hygiene/grooming  PARTICIPATION LIMITATIONS: driving and community activity  PERSONAL FACTORS: Time since onset of injury/illness/exacerbation and 1-2 comorbidities:    T5 paraplegia, B torn RTCs, and cervical stenosis with posterior fusion.are also affecting patient's functional outcome.   REHAB POTENTIAL: Fair time since onset of SCI, torn RTCs  CLINICAL DECISION MAKING: Stable/uncomplicated  EVALUATION COMPLEXITY: High  PLAN:  PT FREQUENCY: 1x/week  PT DURATION: 6 weeks + 10 visits (recert)  PLANNED INTERVENTIONS: Therapeutic exercises, Therapeutic activity, Neuromuscular re-education, Balance training, Gait training, Patient/Family education, Self Care, Joint mobilization, Dry Needling, Electrical stimulation, Wheelchair mobility training, Spinal manipulation, Spinal mobilization, Cryotherapy, Moist heat, Taping, Manual therapy, and Re-evaluation  PLAN FOR NEXT SESSION: standing frame (work up to 30 min), standing frame assessment with Latina Craver, PT, DPT, CSRS  08/27/2023, 11:46 AM

## 2023-08-27 NOTE — Progress Notes (Addendum)
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  Also has unusual sensations/spasms-. Here for f/u on SCI. With newly dx'd Anterocollis greater than torticollis-  L 4th digit DIP tendon rupture B/L carpal tunnel syndrome L ulnar compressive neuropathy. Got approved for power assist w/c- will get in next month Here for fu on paraplegia and associated pain issues and trigger point injections.        Hurts really bad- in "notch" below sternal- said doesn't have any reflux Sx's-   Hurts SO bad!- right above rod in spine, but anterior.   Having pain so bad, it's hard ot do dig sitm,  pain runs from neck/head all the way down to legs.   When loads w/c- feels pain starts on R,  runs around back- to L side- and causes so much pain.   Needs refill of Valium in 2 months.   Seeing me every month now- but injections not lasting very long.   Barbara Cower coming next week to look at stander- and w/c repair- not done yet- hasn't gotten repairs done yet, per pt.  Casters shaking bad!  Getting dry needling from [place] as well. Every week   Plan:  Patient here for trigger point injections for myofascial pain  Consent done and on chart.  Cleaned areas with alcohol and injected using a 27 gauge 1.5 inch needle  Injected  9cc- noen wasted Using 1% Lidocaine with no EPI  Upper traps B/L x2 Levators- B/L  Posterior scalenes Middle scalenes- B/L x2 Splenius Capitus- B/L  Pectoralis Major- B/L  Rhomboids B/L x2 Infraspinatus Teres Major/minor- B/L  Thoracic paraspinals- B/L  Lumbar paraspinals B/L Other injections- hands, Forearms and deltoids   Patient's level of pain prior was 9/10 Current level of pain after injections is improving  There was no bleeding or complications.  Patient was advised to drink a lot of water on day  after injections to flush system Will have increased soreness for 12-48 hours after injections.  Can use Lidocaine patches the day AFTER injections Can use theracane on day of injections in places didn't inject Can use heating pad 4-6 hours AFTER injections   2. Wait for Valium til next appt    3.  Con't PT for dry needling and stander, etc   4. Went over SCI support group    5. F/U in 23month  6. Hasn't seen Dr Jean Rosenthal in awhile   I spent a total of 27   minutes on total care today- >50% coordination of care- due to 7 minute son injections- 20 minutes d/w PT< SCI support group and Dr Jean Rosenthal

## 2023-08-27 NOTE — Addendum Note (Signed)
Addended by: Genice Rouge on: 08/27/2023 09:57 AM   Modules accepted: Level of Service

## 2023-08-27 NOTE — Patient Instructions (Signed)
  Plan:  Patient here for trigger point injections for myofascial pain  Consent done and on chart.  Cleaned areas with alcohol and injected using a 27 gauge 1.5 inch needle  Injected  9cc- noen wasted Using 1% Lidocaine with no EPI  Upper traps B/L x2 Levators- B/L  Posterior scalenes Middle scalenes- B/L x2 Splenius Capitus- B/L  Pectoralis Major- B/L  Rhomboids B/L x2 Infraspinatus Teres Major/minor- B/L  Thoracic paraspinals- B/L  Lumbar paraspinals B/L Other injections- hands, Forearms and deltoids   Patient's level of pain prior was 9/10 Current level of pain after injections is improving  There was no bleeding or complications.  Patient was advised to drink a lot of water on day after injections to flush system Will have increased soreness for 12-48 hours after injections.  Can use Lidocaine patches the day AFTER injections Can use theracane on day of injections in places didn't inject Can use heating pad 4-6 hours AFTER injections   2. Wait for Valium til next appt    3.  Con't PT for dry needling and stander, etc   4. Went over SCI support group    5. F/U in 54month

## 2023-09-10 ENCOUNTER — Ambulatory Visit: Payer: 59 | Admitting: Physical Medicine and Rehabilitation

## 2023-09-10 ENCOUNTER — Ambulatory Visit: Payer: Self-pay | Admitting: Physical Therapy

## 2023-09-11 ENCOUNTER — Ambulatory Visit: Payer: 59 | Attending: Family Medicine | Admitting: Physical Therapy

## 2023-09-11 DIAGNOSIS — M25511 Pain in right shoulder: Secondary | ICD-10-CM | POA: Diagnosis present

## 2023-09-11 DIAGNOSIS — M25552 Pain in left hip: Secondary | ICD-10-CM | POA: Diagnosis present

## 2023-09-11 DIAGNOSIS — G8929 Other chronic pain: Secondary | ICD-10-CM | POA: Insufficient documentation

## 2023-09-11 DIAGNOSIS — M546 Pain in thoracic spine: Secondary | ICD-10-CM | POA: Insufficient documentation

## 2023-09-11 DIAGNOSIS — M25551 Pain in right hip: Secondary | ICD-10-CM | POA: Diagnosis present

## 2023-09-11 DIAGNOSIS — M25512 Pain in left shoulder: Secondary | ICD-10-CM | POA: Insufficient documentation

## 2023-09-11 DIAGNOSIS — M5459 Other low back pain: Secondary | ICD-10-CM | POA: Insufficient documentation

## 2023-09-11 DIAGNOSIS — M6281 Muscle weakness (generalized): Secondary | ICD-10-CM | POA: Insufficient documentation

## 2023-09-11 NOTE — Therapy (Signed)
OUTPATIENT PHYSICAL THERAPY NEURO TREATMENT - DISCHARGE NOTE   Patient Name: Devin Collins MRN: [id number] DOB:30-Oct-1980, 42 y.o., male Today's Date: 09/11/2023   PCP: Dorian Heckle, MD (per chart) REFERRING PROVIDER: Genice Rouge, MD  PHYSICAL THERAPY DISCHARGE SUMMARY  Visits from Start of Care: 13  Current functional level related to goals / functional outcomes: Mod I   Remaining deficits: Increased shoulder pain and disability   Education / Equipment: Handout for HEP   Patient agrees to discharge. Patient goals were partially met. Patient is being discharged due to maximized rehab potential.      END OF SESSION:  PT End of Session - 09/11/23 1308     Visit Number 13    Number of Visits 17   recert   Date for PT Re-Evaluation 10/14/23   recert, to allow for scheduling delays   Authorization Type [employer] Medicare    Progress Note Due on Visit 10    PT Start Time 1308    PT Stop Time 1358    PT Time Calculation (min) 50 min    Activity Tolerance Patient tolerated treatment well    Behavior During Therapy [facility] for tasks assessed/performed                         Past Medical History:  Diagnosis Date   Arthritis    Decubitus ulcer of sacral region, stage 4 (HCC) 10/03/2017   Post-traumatic paraplegia 2004   Scoliosis    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[facility])    Past Surgical History:  Procedure Laterality Date   COSMETIC SURGERY  10/05/2018   flap closure sacral wound   IVC FILTER INSERTION     posterior lumbar laminectomy and fusion  12/03/2018   SPINE SURGERY  11/09/2018   lumbar fusion    THORACIC FUSION     T10-11   THORACIC LAMINECTOMY  08/2005   Patient Active Problem List   Diagnosis Date Noted   Ulnar neuropathy at elbow, left 10/31/2021   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   Torticollis, acquired 07/30/2021   Post-traumatic paraplegia 06/05/2021   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   Hip pain, bilateral 01/05/2021    Vitamin D deficiency 01/05/2021   Myofascial pain 01/05/2021   Loss of balance 11/17/2020   Neurogenic bladder 11/17/2020   Wheelchair dependence 07/21/2020   Paraplegia (HCC) 05/22/2020   Spasticity 05/22/2020    ONSET DATE: 05/07/2023 (referral date)  REFERRING DIAG: G82.20 (ICD-10-CM) - Paraplegia (HCC) M79.18 (ICD-10-CM) - Myofascial pain Z99.3 (ICD-10-CM) - Wheelchair dependence  THERAPY DIAG:  Muscle weakness (generalized)  Chronic pain of both shoulders  Pain in thoracic spine  Other low back pain  Pain of both hip joints  Rationale for Evaluation and Treatment: Rehabilitation  SUBJECTIVE:  SUBJECTIVE STATEMENT: Pt reports that he is doing alright, on Sunday he heard/felt a popping in his back when reaching for something in his car, ever since then has felt pain down his L side of his trunk down into his buttocks and on his R side when reaching. Pt also describes the pain as being under his ribs around the front and around the sides. However, since this popping he is been able to sit up straighter and has improved sitting balance.  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     PAIN:  Are you having pain? Yes: NPRS scale: 5/10 Pain location: everywhere Pain description: "popping" Aggravating factors: laying prone, any movement Relieving factors: N/A  Pt also states from an aggravation point of view pain is 12/10  PRECAUTIONS: Fall  RED FLAGS: None   WEIGHT BEARING RESTRICTIONS: No  FALLS: Has patient fallen in last 6 months? Yes. Number of falls unknown, one  witnessed fall by this therapist that needed total A to return to w/c from the ground  LIVING ENVIRONMENT: Lives with: lives with their family Lives in: House/apartment Home is accessible to patient  PLOF: Independent with basic ADLs, Independent with household mobility with device, Independent with community mobility with device, Independent with transfers, and Requires assistive device for independence  PATIENT GOALS: "to maintain my independence"  OBJECTIVE:   DIAGNOSTIC FINDINGS:  See chart for more extensive review.  MRI PELVIS W AND WO CONTRAST, 04/18/2023 5:59 PM  IMPRESSION: CONCLUSION: 1. 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 No evidence of acute osteomyelitis. 2. Interval slight decrease in size of the right buttock mass as above, favored large adventitial bursitis. Interval increased fluid component in lateral aspect of the bursa.  COGNITION: Overall cognitive status: Within functional limits for tasks assessed   SENSATION: T5 paraplegic   POSTURE: rounded shoulders, forward head, increased thoracic kyphosis, posterior pelvic tilt, and flexed trunk   LOWER EXTREMITY ROM:     Passive  Right Eval Left Eval  Hip flexion [facility]  Hip extension    Hip abduction [facility]  Hip adduction    Hip internal rotation    Hip external rotation    Knee flexion [facility]  Knee extension [facility]  Ankle dorsiflexion [facility]  Ankle plantarflexion [facility] WFL  Ankle inversion [facility] WFL  Ankle eversion WFL WFL   (Blank rows = not tested)   Of note: patient does exhibit PROM of BLE WFL in all available planes, however when placed in Modified Thomas stretch position he has onset of B hip flexor spasticity/tightness preventing full ROM.  LOWER EXTREMITY MMT:    MMT Right Eval Left Eval  Hip flexion    Hip extension    Hip abduction    Hip adduction    Hip internal rotation    Hip external rotation     Knee flexion    Knee extension    Ankle dorsiflexion    Ankle plantarflexion    Ankle inversion    Ankle eversion    (Blank rows = not tested)   BLE WFL in supine but when in Modified Thomas position has onset of spasticity in hips Want to try standing frame but need to check bone  density first  Not comfortable in prone, has onset of hip spasticity/tightness as well as all around back  Pain jumps from side to side  On several meds for spasticity L side of trunk is weaker  BED MOBILITY:  Mod I  TRANSFERS: Assistive device utilized: Wheelchair (manual)  Sit to stand:  unable (T5 para) Stand to sit:  unable (T5 para) Chair to chair: Modified independence Floor: Total A   TODAY'S TREATMENT:                                                                                                                             TherEx Seated anterior and lateral leans on blue Swiss ball 3 x 30 sec each direction with min A for sitting balance EOM, pt with most pain felt with lateral lean to the R (stretching of L lats)  TherAct Standing frame evaluation with [employer]. After discussed with Fleet Contras, [employer] and with patient it was decided to process with getting an Roselind Rily. Appropriate documentation to follow.  SPADI: 69/130, 53% pain and disability  Trigger Point Dry-Needling  Treatment instructions: Expect mild to moderate muscle soreness. S/S of pneumothorax if dry needled over a lung field, and to seek immediate medical attention should they occur. Patient verbalized understanding of these instructions and education.  Patient Consent Given: Yes Education handout provided: Previously provided Muscles treated: L and R lats, L and R teres major and minor, R rhomboids Treatment response/outcome: deep ache/pressure; muscle twitch detected    PATIENT EDUCATION: Education details: continue HEP, plan to d/c this date, timeline for standing frame Person educated: Patient Education  method: Explanation Education comprehension: verbalized understanding  HOME EXERCISE PROGRAM: Access Code: [email or web address] URL: [email or web address] Date: 06/04/2023 Prepared by: Peter Congo  Exercises - Supine Shoulder External Rotation Stretch  - 1 x daily - 7 x weekly - 3 sets - 10 reps - Seated Scapular Retraction  - 1 x daily - 7 x weekly - 3 sets - 10 reps - Supine Bilateral Shoulder Protraction  - 1 x daily - 7 x weekly - 3 sets - 10 reps - Seated Shoulder Scaption AROM  - 1 x daily - 7 x weekly - 3 sets - 10 reps - Supine Thoracic Mobilization Towel Roll Vertical with Arm Stretch  - 1 x daily - 7 x weekly - 3 sets - 10 reps - Prone Hip Extension with Bent Knee - One Pillow  - 1 x daily - 7 x weekly - 1 sets - 1 reps - 10 min hold - Modified Thomas Stretch  - 1 x daily - 7 x weekly - 1 sets - 5 reps - 30 sec hold - Supine Suboccipital Release with Tennis Balls  - 1 x daily - 7 x weekly - 1 sets - 1 reps - 5-10 min hold - Supine Bilateral Shoulder External Rotation with Resistance around Wrists  - 1 x daily - 7 x weekly - 3 sets -  10 reps - Shoulder Adduction Unilateral Resistance  - 1 x daily - 7 x weekly - 3 sets - 10 reps - Seated Flexion Stretch with Swiss Ball  - 1 x daily - 7 x weekly - 1 sets - 5 reps - 30 sec hold - Seated Thoracic Flexion and Rotation with Swiss Ball  - 1 x daily - 7 x weekly - 1 sets - 5 reps - 30 sec hold   GOALS: Goals reviewed with patient? Yes   NEW SHORT TERM GOALS:   Target date: 08/19/2023  Pt to trial standing frame and tolerate standing x 15 min with no adverse affects Baseline: 10 min (10/9), 15 min (11/5) Goal status: MET   NEW LONG TERM GOALS:  Target date: 09/16/2023  Pt will be independent with final HEP for improved strength, balance, transfers and ability to complete ADLs independently. Baseline: initial HEP established, finalized review 11/20 Goal status: MET  2.  Pt to improve score on the SPADI to 20% disability  to demonstrate decreased disability level. Baseline: 30% (9/17), 38% disability (10/15), 53% disability (12/5) Goal status: NOT MET  3.  Pt will tolerate standing in standing frame x 30 min to work on weight-bearing through BLE to improve bone health, spasticity management, improve circulation, improve skin health and decrease risk for pressure injuries, improve bowel and bladder function, and decrease risk for contractures.  Baseline: 10 min (10/9), 15 min (11/5), not re-assessed due to time constraints however standing frame evaluation performed (12/5) Goal status: NOT MET    ASSESSMENT:  CLINICAL IMPRESSION: Emphasis of skilled PT session on assessing LTG in preparation for d/c from OPPT services this date as well as performing standing frame evaluation with Fleet Contras, [employer] from [place]. Pt has met 1/3 LTG due to being independent with his final HEP. He does exhibit ongoing shoulder pain and disability as a result of being a full-time wheelchair user x 20 years and having torn B rotator cuffs. Pt is set up with an appropriate HEP to continue to work on his shoulder strengthening and ROM in an effort to continue to decrease his pain and dysfunction. Pt also did not meet his goal of standing x 30 min in standing frame due to time constraints, however he was able to participate in a standing frame evaluation this session. Pt agreeable to d/c and to continue with his HEP at home.   OBJECTIVE IMPAIRMENTS: decreased activity tolerance, decreased balance, decreased mobility, difficulty walking, decreased ROM, decreased strength, increased fascial restrictions, impaired perceived functional ability, increased muscle spasms, impaired sensation, impaired tone, impaired UE functional use, improper body mechanics, postural dysfunction, and pain.   ACTIVITY LIMITATIONS: carrying, lifting, bending, standing, squatting, stairs, and hygiene/grooming  PARTICIPATION LIMITATIONS: driving and community  activity  PERSONAL FACTORS: Time since onset of injury/illness/exacerbation and 1-2 comorbidities:    T5 paraplegia, B torn RTCs, and cervical stenosis with posterior fusion.are also affecting patient's functional outcome.   REHAB POTENTIAL: Fair time since onset of SCI, torn RTCs  CLINICAL DECISION MAKING: Stable/uncomplicated  EVALUATION COMPLEXITY: High      Peter Congo, PT, DPT, CSRS  09/11/2023, 1:59 PM

## 2023-09-12 ENCOUNTER — Encounter: Payer: 59 | Admitting: Physical Medicine and Rehabilitation

## 2023-10-10 ENCOUNTER — Encounter: Payer: Self-pay | Admitting: Physical Medicine and Rehabilitation

## 2023-10-10 ENCOUNTER — Encounter: Payer: 59 | Attending: Physical Medicine and Rehabilitation | Admitting: Physical Medicine and Rehabilitation

## 2023-10-10 VITALS — BP 148/79 | HR 110 | Ht 72.0 in

## 2023-10-10 DIAGNOSIS — M7918 Myalgia, other site: Secondary | ICD-10-CM | POA: Insufficient documentation

## 2023-10-10 DIAGNOSIS — G8220 Paraplegia, unspecified: Secondary | ICD-10-CM | POA: Insufficient documentation

## 2023-10-10 DIAGNOSIS — R252 Cramp and spasm: Secondary | ICD-10-CM | POA: Insufficient documentation

## 2023-10-10 DIAGNOSIS — Z993 Dependence on wheelchair: Secondary | ICD-10-CM | POA: Insufficient documentation

## 2023-10-10 DIAGNOSIS — G249 Dystonia, unspecified: Secondary | ICD-10-CM | POA: Insufficient documentation

## 2023-10-10 MED ORDER — LIDOCAINE HCL 1 % IJ SOLN
9.0000 mL | Freq: Once | INTRAMUSCULAR | Status: AC
Start: 2023-10-10 — End: 2023-10-10
  Administered 2023-10-10: 9 mL

## 2023-10-10 MED ORDER — DIAZEPAM 5 MG PO TABS: 5.0000 mg | ORAL_TABLET | Freq: Three times a day (TID) | ORAL | 5 refills | Status: DC

## 2023-10-10 NOTE — Progress Notes (Signed)
 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  Also has unusual sensations/spasms-. Here for f/u on SCI. With newly dx'd Anterocollis greater than torticollis-  L 4th digit DIP tendon rupture B/L carpal tunnel syndrome L ulnar compressive neuropathy. Got approved for power assist w/c- will get in next month Here for fu on paraplegia and associated pain issues and trigger point injections.           In bad way- awful-   Shoulder surgeon- will not do more imaging- will do shoulder clean out or injections, but no more imaging.   Doesn't want to get OOB-  Because popping of muscles so bad.  When twists spine- muscle pops and pain radiates down RLE.  Legs hurts worse than rest of body.    The only time feels good is when laying in bed- and asleep.   Wishes there was an orthopedic injury to fix.   The most muscles that hurt the most is abdominal muscles and paraspinals- feels like 2 snakes that separate and squirm where muscles are.   Has been Botox 'ed in Iliopsoas- helped, but now feels like it's muscles higher up- in abdomen setting things off.  Like muscles working against each other.   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   2. Patient here for trigger point injections for  Consent done and on chart.  Cleaned areas with alcohol and injected using a 27 gauge 1.5 inch needle  Injected  9cc none wasted Using 1% Lidocaine  with no EPI  Upper  traps B/L x2 Levators- B/L  Posterior scalenes Middle scalenes- B/L x2 Splenius Capitus- B/L  Pectoralis Major- B/L  Rhomboids- B/L x3 Infraspinatus Teres Major/minor Thoracic paraspinals- B/L  Lumbar paraspinals- B/L x2 Other injections- B/L hands, forearms and triceps.    Patient's level of pain prior was 8/10 Current level of pain after injections is 5-6/10  There was no bleeding or complications.  Patient was advised to drink a lot of water on day after injections to flush system Will have increased soreness for 12-48 hours after injections.  Can use Lidocaine  patches the day AFTER injections Can use theracane on day of injections in places didn't inject Can use heating pad 4-6 hours AFTER injections   3. Refilled Valium  5 mg 3x/day- #90- 5 refills   4. Educated pt on dystonia- and Symptoms as well treatments- main good treatment is Botulinum toxin-    5. F/U in 1 month- as scheduled-    I spent a total of 29   minutes on total care today- >50% coordination of care- due to d/w pt about dystonia- and options for it- as well 8 minutes on injections

## 2023-11-10 ENCOUNTER — Ambulatory Visit: Payer: 59 | Admitting: Physical Medicine and Rehabilitation

## 2023-11-14 ENCOUNTER — Encounter: Payer: Self-pay | Admitting: Physical Medicine and Rehabilitation

## 2023-11-14 ENCOUNTER — Encounter: Payer: 59 | Attending: Physical Medicine and Rehabilitation | Admitting: Physical Medicine and Rehabilitation

## 2023-11-14 VITALS — BP 137/77 | HR 90 | Ht 72.0 in | Wt 167.0 lb

## 2023-11-14 DIAGNOSIS — N319 Neuromuscular dysfunction of bladder, unspecified: Secondary | ICD-10-CM | POA: Insufficient documentation

## 2023-11-14 DIAGNOSIS — M7918 Myalgia, other site: Secondary | ICD-10-CM | POA: Insufficient documentation

## 2023-11-14 DIAGNOSIS — Z993 Dependence on wheelchair: Secondary | ICD-10-CM | POA: Insufficient documentation

## 2023-11-14 DIAGNOSIS — G249 Dystonia, unspecified: Secondary | ICD-10-CM | POA: Insufficient documentation

## 2023-11-14 DIAGNOSIS — R252 Cramp and spasm: Secondary | ICD-10-CM | POA: Diagnosis present

## 2023-11-14 DIAGNOSIS — G8220 Paraplegia, unspecified: Secondary | ICD-10-CM | POA: Insufficient documentation

## 2023-11-14 MED ORDER — LIDOCAINE HCL 1 % IJ SOLN
9.0000 mL | Freq: Once | INTRAMUSCULAR | Status: AC
Start: 2023-11-14 — End: 2023-11-14
  Administered 2023-11-14: 9 mL

## 2023-11-14 MED ORDER — ONABOTULINUMTOXINA 100 UNITS IJ SOLR
100.0000 [IU] | Freq: Once | INTRAMUSCULAR | Status: AC
Start: 2023-11-14 — End: 2023-11-14
  Administered 2023-11-14: 100 [IU] via INTRAMUSCULAR

## 2023-11-14 NOTE — Patient Instructions (Signed)
 Plan: Patient here for trigger point injections for  Consent done and on chart.  Cleaned areas with alcohol and injected using a 27 gauge 1.5 inch needle  Injected  9cc- none wasted Using 1% Lidocaine  with no EPI  Upper traps B/L  Levators B/L  Posterior scalenes B/L  Middle scalenes B/L  Splenius Capitus B/Lx2 Pectoralis Major B/L  Rhomboids B/L x3 Infraspinatus Teres Major/minor- B/L  Thoracic paraspinals- B/L x3 Lumbar paraspinals B/L  Other injections-  b?l deltoids, biceps, R triceps forearms and hands   There was no bleeding or complications.  Patient was advised to drink a lot of water on day after injections to flush system Will have increased soreness for 12-48 hours after injections.  Can use Lidocaine  patches the day AFTER injections Can use theracane on day of injections in places didn't inject Can use heating pad 4-6 hours AFTER injections   2. Botox  of back and neck muscles for dystonia  - used 100 units in 2cc saline  Injected: B/L upper traps x2  -B/L rhomboids x 4 spread out  -B/L teres  B/L thoracic paraspinals x3  Pt aware that this is an unusual dx and there are no FDA guidelines for back dystonia- we are trying to see how it works to reduce his dystonic movements- and might be weak as a result-  - also got bladder botox  300 units in bladder last week, so got a max of 400 units in last 7 days.  -will see how this goes and try to do q3 months .  3. Con't baclofen- con't gabapentin and valium  - for spasticity   4. Doesn't need refills  5. F/u in 44month q1 months for trP injections and Botox  in 3months.

## 2023-11-14 NOTE — Progress Notes (Signed)
 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  Also has unusual sensations/spasms-. Here for f/u on SCI. With newly dx'd Anterocollis greater than torticollis-  L 4th digit DIP tendon rupture B/L carpal tunnel syndrome L ulnar compressive neuropathy. Got approved for power assist w/c- will get in next month Here for fu on paraplegia and associated pain issues and trigger point injections.           When loading and unloading w/c-  Pops and radiates pain down across mid back-  Doesn't want to push w/c Both sides of head- occiput has been so sore.   2 friends died in last 3 days- 1 by suicide and 1 of pneumonia   Flank pain-and thinks- can pick up fosfomycin today- has Rx waiting for him.   Botox  of bladder last week- setting him off/UTI So tender and tight- hard to be touched right now-  In  a bad place-   Got standing frame   Plan: Patient here for trigger point injections for  Consent done and on chart.  Cleaned areas with alcohol and injected using a 27 gauge 1.5 inch needle  Injected  9cc- none wasted Using 1% Lidocaine  with no EPI  Upper traps B/L  Levators B/L  Posterior scalenes B/L  Middle scalenes B/L  Splenius Capitus B/Lx2 Pectoralis Major B/L  Rhomboids B/L x3 Infraspinatus Teres Major/minor- B/L  Thoracic paraspinals- B/L x3 Lumbar paraspinals B/L  Other injections-  b?l deltoids, biceps, R triceps forearms and hands   There was no bleeding or complications.  Patient was advised to drink a lot of water on day after injections to flush system Will have increased soreness for 12-48 hours after injections.  Can use Lidocaine  patches the day AFTER injections Can use theracane on day of injections in places didn't inject Can use heating pad 4-6 hours AFTER injections   2. Botox  of  back and neck muscles for dystonia  - used 100 units in 2cc saline  Injected: B/L upper traps x2  -B/L rhomboids x 4 spread out  -B/L teres  B/L thoracic paraspinals x3  Pt aware that this is an unusual dx and there are no FDA guidelines for back dystonia- we are trying to see how it works to reduce his dystonic movements- and might be weak as a result-  - also got bladder botox  300 units in bladder last week, so got a max of 400 units in last 7 days.  -will see how this goes and try to do q3 months .  3. Con't baclofen- con't gabapentin and valium  - for spasticity   4. Doesn't need refills  5. F/u in 66month q1 months for trP injections and Botox  in 3months.   6.d/w pt about SCI support group- and plans- and dealing with grief.    I spent a total of   38 minutes on total care today- >50% coordination of care- due to  Botox  and injections- and rest dealing with grief, SCI support group- as detailed above.

## 2023-11-19 ENCOUNTER — Encounter: Payer: Self-pay | Admitting: Physical Therapy

## 2023-12-08 ENCOUNTER — Encounter: Payer: 59 | Attending: Physical Medicine and Rehabilitation | Admitting: Physical Medicine and Rehabilitation

## 2023-12-08 ENCOUNTER — Encounter: Payer: Self-pay | Admitting: Physical Medicine and Rehabilitation

## 2023-12-08 VITALS — BP 141/87 | HR 93 | Ht 72.0 in | Wt 166.6 lb

## 2023-12-08 DIAGNOSIS — Z993 Dependence on wheelchair: Secondary | ICD-10-CM | POA: Diagnosis present

## 2023-12-08 DIAGNOSIS — M7918 Myalgia, other site: Secondary | ICD-10-CM | POA: Diagnosis present

## 2023-12-08 DIAGNOSIS — G8220 Paraplegia, unspecified: Secondary | ICD-10-CM | POA: Diagnosis present

## 2023-12-08 DIAGNOSIS — G249 Dystonia, unspecified: Secondary | ICD-10-CM | POA: Insufficient documentation

## 2023-12-08 MED ORDER — LIDOCAINE HCL 1 % IJ SOLN
9.0000 mL | Freq: Once | INTRAMUSCULAR | Status: AC
Start: 2023-12-08 — End: 2023-12-08
  Administered 2023-12-08: 9 mL

## 2023-12-08 NOTE — Patient Instructions (Signed)
 Plan: Will look into meds for dystonia to see if there are any other choices.    2. Patient here for trigger point injections for  Consent done and on chart.  Cleaned areas with alcohol and injected using a 27 gauge 1.5 inch needle  Injected  9 cc- none wasted Using 1% Lidocaine with no EPI  Upper traps- B/L  Levators B?L  Posterior scalenes Middle scalenes B/L x2 Splenius Capitus- B/L  Pectoralis Major- B/L  Rhomboids- B/L x3 Infraspinatus Teres Major/minor- B/L  Thoracic paraspinals- B/L x3 Lumbar paraspinals- B/L x2 Other injections-  B/L deltoids, forearms and B/L hands    There was no bleeding or complications.  Patient was advised to drink a lot of water on day after injections to flush system Will have increased soreness for 12-48 hours after injections.  Can use Lidocaine patches the day AFTER injections Can use theracane on day of injections in places didn't inject Can use heating pad 4-6 hours AFTER injections   3. Will do Botox in early May after 5/7   4. Con't Valium- doesn't need refills yet- filled 10/10/23   5. F/U q 43month- for Trp injections and Botox early February.

## 2023-12-08 NOTE — Progress Notes (Signed)
 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  Also has unusual sensations/spasms-. Here for f/u on SCI. With newly dx'd Anterocollis greater than torticollis-  L 4th digit DIP tendon rupture B/L carpal tunnel syndrome L ulnar compressive neuropathy. Got approved for power assist w/c- will get in next month Here for fu on paraplegia and associated pain issues and trigger point injections.               Difficulty controlling arms due to HA's and spasms down from head to arms/torso.   The right side towards low back is better- not moving as much.   But left side isn't better.   Stil having dystonic movements in abdomen as well .  Doing core strengthening and theraband work lately.  Cannot get good hard push up hill anymore-  Gets 10 ft and then has to do pressure relief because muscles pulling.     Plan: Will look into meds for dystonia to see if there are any other choices.    2. Patient here for trigger point injections for  Consent done and on chart.  Cleaned areas with alcohol and injected using a 27 gauge 1.5 inch needle  Injected  9 cc- none wasted Using 1% Lidocaine with no EPI  Upper traps- B/L  Levators B?L  Posterior scalenes Middle scalenes B/L x2 Splenius Capitus- B/L  Pectoralis Major- B/L  Rhomboids- B/L x3 Infraspinatus Teres Major/minor- B/L  Thoracic paraspinals- B/L x3 Lumbar paraspinals- B/L x2 Other injections-  B/L deltoids, forearms and B/L hands    There was no bleeding or complications.  Patient was advised to drink a lot of water on day after injections to flush system Will have increased soreness for 12-48 hours after injections.  Can use Lidocaine patches the day AFTER injections Can use theracane on day of injections in places didn't inject Can use heating pad  4-6 hours AFTER injections   3. Will do Botox in early May after 5/7   4. Con't Valium- doesn't need refills yet- filled 10/10/23   5. F/U q 2month- for Trp injections and Botox early February.

## 2024-01-19 ENCOUNTER — Ambulatory Visit: Payer: 59 | Admitting: Physical Medicine and Rehabilitation

## 2024-02-13 ENCOUNTER — Encounter: Payer: Self-pay | Admitting: Physical Medicine and Rehabilitation

## 2024-02-13 ENCOUNTER — Encounter: Payer: 59 | Attending: Physical Medicine and Rehabilitation | Admitting: Physical Medicine and Rehabilitation

## 2024-02-13 VITALS — BP 110/66 | HR 92 | Ht 72.0 in | Wt 176.0 lb

## 2024-02-13 DIAGNOSIS — G249 Dystonia, unspecified: Secondary | ICD-10-CM | POA: Diagnosis present

## 2024-02-13 DIAGNOSIS — M7918 Myalgia, other site: Secondary | ICD-10-CM | POA: Diagnosis present

## 2024-02-13 DIAGNOSIS — R252 Cramp and spasm: Secondary | ICD-10-CM | POA: Insufficient documentation

## 2024-02-13 DIAGNOSIS — G8220 Paraplegia, unspecified: Secondary | ICD-10-CM | POA: Insufficient documentation

## 2024-02-13 MED ORDER — LIDOCAINE HCL 1 % IJ SOLN
9.0000 mL | Freq: Once | INTRAMUSCULAR | Status: AC
Start: 2024-02-13 — End: 2024-02-13
  Administered 2024-02-13: 9 mL

## 2024-02-13 MED ORDER — ONABOTULINUMTOXINA 100 UNITS IJ SOLR
200.0000 [IU] | Freq: Once | INTRAMUSCULAR | Status: AC
Start: 2024-02-13 — End: 2024-02-13
  Administered 2024-02-13: 200 [IU] via INTRAMUSCULAR

## 2024-02-13 NOTE — Progress Notes (Signed)
 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  Also has unusual sensations/spasms-. Here for f/u on SCI. With newly dx'd Anterocollis greater than torticollis-  L 4th digit DIP tendon rupture B/L carpal tunnel syndrome L ulnar compressive neuropathy. Got approved for power assist w/c- will get in next month Here for fu on paraplegia and associated pain issues and trigger point injections and Botox  for dystonia    Has gained 10 lbs-  Was told to watch his weight.   Worried about weight gain.   Is coaching basketball!  Botox  helped some on R side last time Now has radiating pains in legs- and goes into back- lower buttocks-  Hurts really bad- thinks it's muscles to do with hips.  [name] worked on L shoulder- so hard  2 months since I last saw him-  So hurting a lot more.    [name] found lump in breast.  Has appt 5/21- needs mammogram-    Plan:  Patient here for trigger point injections for  Consent done and on chart.  Cleaned areas with alcohol and injected using a 27 gauge 1.5 inch needle  Injected  9cc none wasted Using 1% Lidocaine  with no EPI  Upper traps B/L  Levators- B/L  Posterior scalenes Middle scalenes- B/L x2 Splenius Capitus- B/L  Pectoralis Major not today Rhomboids B/L x4 Infraspinatus Teres Major/minor b/L x2 Thoracic paraspinals b/L x2 Lumbar paraspinals B/L x3 Other injections-B/L deltoids and hands-       There was no bleeding or complications.  Patient was advised to drink a lot of water on day after injections to flush system Will have increased soreness for 12-48 hours after injections.  Can use Lidocaine  patches the day AFTER injections Can use theracane on day of injections in places didn't inject Can use heating pad 4-6 hours AFTER injections   2.  Change diet- no  more snacking- cut out carbs.    3. Ask for diagnostic mammogram For wife.    4. Botox  of back- and neck muscles for dystonia.   Used 200 units in 2cc saline- injected B/L torso  - injected B/L levators -  B/L upper traps and supraspinatus - B/L  Rhomboids x2  - B/L- thoracic and lumbar paraspinals x8    5. Con't Valium - doesn't need refills  6. F/U in 102month- and Botox  in 3 months-    I spent a total of  32  minutes on total care today- >50% coordination of care- due to 6 minutes trP injections and 6 minutes botox - rest was d/w pt about his pain, SCI and spasticity

## 2024-02-13 NOTE — Patient Instructions (Signed)
 Plan:  Patient here for trigger point injections for  Consent done and on chart.  Cleaned areas with alcohol and injected using a 27 gauge 1.5 inch needle  Injected  9cc none wasted Using 1% Lidocaine  with no EPI  Upper traps B/L  Levators- B/L  Posterior scalenes Middle scalenes- B/L x2 Splenius Capitus- B/L  Pectoralis Major not today Rhomboids B/L x4 Infraspinatus Teres Major/minor b/L x2 Thoracic paraspinals b/L x2 Lumbar paraspinals B/L x3 Other injections-B/L deltoids and hands-       There was no bleeding or complications.  Patient was advised to drink a lot of water on day after injections to flush system Will have increased soreness for 12-48 hours after injections.  Can use Lidocaine  patches the day AFTER injections Can use theracane on day of injections in places didn't inject Can use heating pad 4-6 hours AFTER injections   2.  Change diet- no more snacking- cut out carbs.    3. Ask for diagnostic mammogram For wife.    4. Botox  of back- and neck muscles for dystonia.   Used 200 units in 2cc saline- injected B/L torso  - injected B/L levators -  B/L upper traps and supraspinatus - B/L  Rhomboids x2  - B/L- thoracic and lumbar paraspinals x8    5. Con't Valium - doesn't need refills  6. F/U in 9month- and Botox  in 3 months-

## 2024-03-15 ENCOUNTER — Encounter: Payer: Self-pay | Admitting: Physical Medicine and Rehabilitation

## 2024-03-15 ENCOUNTER — Encounter: Payer: 59 | Attending: Physical Medicine and Rehabilitation | Admitting: Physical Medicine and Rehabilitation

## 2024-03-15 VITALS — BP 95/61 | HR 89 | Ht 72.0 in | Wt 175.0 lb

## 2024-03-15 DIAGNOSIS — G249 Dystonia, unspecified: Secondary | ICD-10-CM | POA: Insufficient documentation

## 2024-03-15 DIAGNOSIS — G950 Syringomyelia and syringobulbia: Secondary | ICD-10-CM | POA: Diagnosis not present

## 2024-03-15 DIAGNOSIS — R252 Cramp and spasm: Secondary | ICD-10-CM | POA: Diagnosis present

## 2024-03-15 DIAGNOSIS — G8220 Paraplegia, unspecified: Secondary | ICD-10-CM

## 2024-03-15 DIAGNOSIS — M7918 Myalgia, other site: Secondary | ICD-10-CM

## 2024-03-15 MED ORDER — LIDOCAINE HCL 1 % IJ SOLN
9.0000 mL | Freq: Once | INTRAMUSCULAR | Status: AC
Start: 2024-03-15 — End: 2024-03-15
  Administered 2024-03-15: 9 mL

## 2024-03-15 NOTE — Addendum Note (Signed)
 Addended by: Edgar Goods D on: 03/15/2024 04:24 PM   Modules accepted: Orders

## 2024-03-15 NOTE — Patient Instructions (Signed)
 Plan: Will see if taking 3rd dose of Valium  before dig stim- will help the dystonic reactions/spasms-  that are affecting his ability to do bowel program. If it doesn't work, might need to increase Valium  to 5/5 and 10 mg at night.    2. Patient here for trigger point injections for dystonia/myofascial pain  Consent done and on chart.  Cleaned areas with alcohol and injected using a 27 gauge 1.5 inch needle  Injected 9cc- none wasted Using 1% Lidocaine  with no EPI  Upper traps B/L  x2 Levators B/L  Posterior scalenes B/L  Middle scalenes B/L x2 Splenius Capitus B/L Pectoralis Major- B/L  Rhomboids Infraspinatus Teres Major/minor Thoracic paraspinals Lumbar paraspinals Other injections- SCM's;  hands; L forearm and B/L deltoids and triceps    There was no bleeding or complications.  Patient was advised to drink a lot of water on day after injections to flush system Will have increased soreness for 12-48 hours after injections.  Can use Lidocaine  patches the day AFTER injections Can use theracane on day of injections in places didn't inject Can use heating pad 4-6 hours AFTER injections  3. Con't Baclofen and Valium   4. Con't Percocet per pain mgmt.   5. We discussed constant pain just nags at your emotions and makes people angry. Give yourself grace.    6.  F/U in 1 month for trP injections; Botox  in 2months-

## 2024-03-15 NOTE — Progress Notes (Signed)
 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  Also has unusual sensations/spasms-. Here for f/u on SCI. With newly dx'd Anterocollis greater than torticollis-  L 4th digit DIP tendon rupture B/L carpal tunnel syndrome L ulnar compressive neuropathy. Got approved for power assist w/c- will get in next month Here for fu on paraplegia and associated pain issues and trigger point injections and Botox  for dystonia    Left side wasn't as tight-  But R side, not looser.  Also,  feels like L lats- feels weak-   Has to take dog to vet and sees if has ot put dog down.   Some emotional issues with step daughter [employer]- camp- 1st day of camp-  and son- age 8  Hurting in abdomen a lot- across the top and around bottom Hard to do dig stim- feels like Lats flips and hip shifts- and sphincter tightens up so cannot finish dig stim- for the past week.  Started 2-4 weeks ago- when lays flat- so awful tight.     Has 2-3 minutes until muscles roll again- and then cannot finish dig stim again.  Not constipated- still soft.  Doesn't want to get constipated.  Stopped miralax because made him too loose.   Does dig stim at 9-10 pm-    When brings back L arm, feels like he loses his balance completely- Ie L shoulder extension.    Plan: Will see if taking 3rd dose of Valium  before dig stim- will help the dystonic reactions/spasms-  that are affecting his ability to do bowel program. If it doesn't work, might need to increase Valium  to 5/5 and 10 mg at night.    2. Patient here for trigger point injections for dystonia/myofascial pain  Consent done and on chart.  Cleaned areas with alcohol and injected using a 27 gauge 1.5 inch needle  Injected 9cc- none wasted Using 1% Lidocaine  with no EPI  Upper traps B/L  x2 Levators B/L   Posterior scalenes B/L  Middle scalenes B/L x2 Splenius Capitus B/L Pectoralis Major- B/L  Rhomboids Infraspinatus Teres Major/minor Thoracic paraspinals Lumbar paraspinals Other injections- SCM's;  hands; L forearm and B/L deltoids and triceps    There was no bleeding or complications.  Patient was advised to drink a lot of water on day after injections to flush system Will have increased soreness for 12-48 hours after injections.  Can use Lidocaine  patches the day AFTER injections Can use theracane on day of injections in places didn't inject Can use heating pad 4-6 hours AFTER injections  3. Con't Baclofen and Valium   4. Con't Percocet per pain mgmt.   5. We discussed constant pain just nags at your emotions and makes people angry. Give yourself grace.    6.  F/U in 1 month for trP injections; Botox  in 2months-   I spent a total of  28  minutes on total care today- >50% coordination of care- due to 8 min on trp injections- and  discussion with pt about options for spasticity/dystonia-

## 2024-04-16 ENCOUNTER — Encounter: Payer: 59 | Admitting: Physical Medicine and Rehabilitation

## 2024-04-19 ENCOUNTER — Other Ambulatory Visit: Payer: Self-pay | Admitting: Physical Medicine and Rehabilitation

## 2024-05-24 ENCOUNTER — Encounter: Payer: Self-pay | Admitting: Physical Medicine and Rehabilitation

## 2024-05-24 ENCOUNTER — Telehealth: Payer: Self-pay

## 2024-05-24 ENCOUNTER — Encounter: Attending: Physical Medicine and Rehabilitation | Admitting: Physical Medicine and Rehabilitation

## 2024-05-24 VITALS — BP 125/73 | HR 82 | Ht 72.0 in | Wt 177.8 lb

## 2024-05-24 DIAGNOSIS — G249 Dystonia, unspecified: Secondary | ICD-10-CM | POA: Diagnosis not present

## 2024-05-24 DIAGNOSIS — M7918 Myalgia, other site: Secondary | ICD-10-CM | POA: Insufficient documentation

## 2024-05-24 DIAGNOSIS — R252 Cramp and spasm: Secondary | ICD-10-CM | POA: Diagnosis present

## 2024-05-24 DIAGNOSIS — Z993 Dependence on wheelchair: Secondary | ICD-10-CM | POA: Insufficient documentation

## 2024-05-24 DIAGNOSIS — G8220 Paraplegia, unspecified: Secondary | ICD-10-CM | POA: Insufficient documentation

## 2024-05-24 DIAGNOSIS — G114 Hereditary spastic paraplegia: Secondary | ICD-10-CM | POA: Diagnosis present

## 2024-05-24 MED ORDER — ONABOTULINUMTOXINA 100 UNITS IJ SOLR
100.0000 [IU] | Freq: Once | INTRAMUSCULAR | Status: AC
Start: 2024-05-24 — End: 2024-05-24
  Administered 2024-05-24: 300 [IU] via INTRAMUSCULAR

## 2024-05-24 MED ORDER — LIDOCAINE HCL 1 % IJ SOLN
9.0000 mL | Freq: Once | INTRAMUSCULAR | Status: AC
Start: 2024-05-24 — End: 2024-05-24
  Administered 2024-05-24: 9 mL

## 2024-05-24 NOTE — Patient Instructions (Addendum)
 Patient here for trigger point injections for  Consent done and on chart.  Cleaned areas with alcohol and injected using a 27 gauge 1.5 inch needle  Injected 9cc- none wasted Using 1% Lidocaine  with no EPI  Upper traps B/L  Levators- B/L  Posterior scalenes Middle scalenes- B/L  x2 Splenius Capitus- B/L  Pectoralis Major- B/L  Rhomboids- B/L x4 Infraspinatus Teres Major/minor- B/L x2 Thoracic paraspinals- B/L x3 Lumbar paraspinals Other injections- B/L forearms, B/L hands   Patient's level of pain prior was Current level of pain after injections is  There was no bleeding or complications.  Patient was advised to drink a lot of water on day after injections to flush system Will have increased soreness for 12-48 hours after injections.  Can use Lidocaine  patches the day AFTER injections Can use theracane on day of injections in places didn't inject Can use heating pad 4-6 hours AFTER injections   2. Botox  Injection for spasticity/dystonia.  Went over risk and benefits- consent signed- for dystonia of back muscles  Injected a total of 300units 1:1 ratio with 1cc syringe and 27 gauge needle.   B/L- upper traps- 20 units each  B/L Splenius capitus 10 units each  Rhomboid B/L 30 units each in 4 places total  B/L teres 20 units each  B/L latissimus dorsi 20 units in 2 spots   B/L thoracic paraspinals-  30 units each- 3 spots  B/L cervical paraspinals 10 units each  B/L lumbar paraspinals- 10 units each   3. Went over seeing Dr Devin- and his goals  4. To see if any hardware broken- and anything can be fixed due to leaning to L- and to look at Syrinx.   5.  F/U in 58month- for Trp injections  6. Con't Valium  for spasticity

## 2024-05-24 NOTE — Telephone Encounter (Signed)
 Called to make patient aware that Ear buds were found in Procedure Room 2.  Patient notified and will pick up when he comes in for follow up appointment on 06/23/24 with  Dr. Lovorn.

## 2024-05-24 NOTE — Progress Notes (Signed)
 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  Also has unusual sensations/spasms-. Here for f/u on SCI. With newly dx'd Anterocollis greater than torticollis-  L 4th digit DIP tendon rupture B/L carpal tunnel syndrome L ulnar compressive neuropathy. Got approved for power assist w/c- will get in next month Here for fu on paraplegia and associated pain issues and trigger point injections and Botox  for dystonia         Likes sleeping- not being awake anymore.  Not really sure if depressed, but so much pain.   Pain meds- percocet 10/325 mg 4 tabs/day. Rarely takes over 4 pills/day. Gets 6 pills/day- but will make him sleep all day.   Leaning to L when lets go, leans strongly to L.   Something in butt keeps moving to hips when moves and is painful-  This last set of Botox  shots- mild improvement.   Has been almost 3 months since last seen. Had to cancel 2 appointments- in last 3 months.   Appointment with Dr Devin. This week.  When turns head to L, popping in neck.   Has fallen in last 1 year 3x/and busted head open- didn't get seen at ED.  Can put w/c in car is OK, but  When does biceps moves, feels like has knot in diaphragm all the time.    Hurts on R>L on buttocks.    Exam: Awake, alert, appropriate, flat affect, more monotone, NAD In manual w/c    Plan:  Patient here for trigger point injections for  Consent done and on chart.  Cleaned areas with alcohol and injected using a 27 gauge 1.5 inch needle  Injected 9cc- none wasted Using 1% Lidocaine  with no EPI  Upper traps B/L  Levators- B/L  Posterior scalenes Middle scalenes- B/L  x2 Splenius Capitus- B/L  Pectoralis Major- B/L  Rhomboids- B/L x4 Infraspinatus Teres Major/minor- B/L x2 Thoracic paraspinals- B/L x3 Lumbar paraspinals Other  injections- B/L forearms, B/L hands   Patient's level of pain prior was Current level of pain after injections is  There was no bleeding or complications.  Patient was advised to drink a lot of water on day after injections to flush system Will have increased soreness for 12-48 hours after injections.  Can use Lidocaine  patches the day AFTER injections Can use theracane on day of injections in places didn't inject Can use heating pad 4-6 hours AFTER injections   2. Botox  Injection for spasticity/dystonia.  Went over risk and benefits- consent signed- for dystonia of back muscles  Injected a total of 300units 1:1 ratio with 1cc syringe and 27 gauge needle.   B/L- upper traps- 20 units each  B/L Splenius capitus 10 units each  Rhomboid B/L 30 units each in 4 places total  B/L teres 20 units each  B/L latissimus dorsi 20 units in 2 spots   B/L thoracic paraspinals-  30 units each- 3 spots  B/L cervical paraspinals 10 units each  B/L lumbar paraspinals- 10 units each   3. Went over seeing Dr Devin- and his goals  4. To see if any hardware broken- and anything can be fixed due to leaning to L- and to look at Syrinx.   5.  F/U in 40month- for Trp injections  6. Con't Valium  as needed- last refill 04/2024.   I spent a total  of 45   minutes on total care today- >50% coordination of care- due to  Botox  and Trp injections and d/w pt about Dr Devin- 25 minutes on injections- rest on appt.

## 2024-06-23 ENCOUNTER — Encounter: Payer: Self-pay | Admitting: Physical Medicine and Rehabilitation

## 2024-06-23 ENCOUNTER — Encounter: Attending: Physical Medicine and Rehabilitation | Admitting: Physical Medicine and Rehabilitation

## 2024-06-23 VITALS — BP 114/70 | HR 93 | Ht 72.0 in | Wt 178.6 lb

## 2024-06-23 DIAGNOSIS — Z993 Dependence on wheelchair: Secondary | ICD-10-CM | POA: Insufficient documentation

## 2024-06-23 DIAGNOSIS — G249 Dystonia, unspecified: Secondary | ICD-10-CM | POA: Insufficient documentation

## 2024-06-23 DIAGNOSIS — G8220 Paraplegia, unspecified: Secondary | ICD-10-CM | POA: Diagnosis not present

## 2024-06-23 DIAGNOSIS — M7918 Myalgia, other site: Secondary | ICD-10-CM | POA: Diagnosis not present

## 2024-06-23 DIAGNOSIS — R252 Cramp and spasm: Secondary | ICD-10-CM | POA: Insufficient documentation

## 2024-06-23 MED ORDER — LIDOCAINE HCL 1 % IJ SOLN
9.0000 mL | Freq: Once | INTRAMUSCULAR | Status: AC
Start: 2024-06-23 — End: 2024-06-23
  Administered 2024-06-23: 9 mL

## 2024-06-23 NOTE — Patient Instructions (Signed)
 Plan: Put bandaid  on L lateral foot/ankle.    2.  D/w pt about his dystonia is probably why he's popping so much- and went over that doing Botox  next visit.   3. Patient here for trigger point injections for  Consent done and on chart.  Cleaned areas with alcohol and injected using a 27 gauge 1.5 inch needle  Injected  9cc- none wasted Using 1% Lidocaine  with no EPI  Upper traps B/L x2 Levators- B/L  Posterior scalenes- b/L  Middle scalenes- b/L x2 Splenius Capitus- B/L  Pectoralis Major- b/L  Rhomboids- B/L x4 Deltoids- B/L  Teres Major/minor- b/L  Thoracic paraspinals- B/L x3 Lumbar paraspinals- B/L  Other injections- B/L Forearms, B/ lhands, B/L piriformis,    Patient's level of pain prior was Current level of pain after injections is  There was no bleeding or complications.  Patient was advised to drink a lot of water on day after injections to flush system Will have increased soreness for 12-48 hours after injections.  Can use Lidocaine  patches the day AFTER injections Can use theracane on day of injections in places didn't inject Can use heating pad 4-6 hours AFTER injections  3.  F/U in 1 months for Botox  AND trp Injections.

## 2024-06-23 NOTE — Progress Notes (Signed)
 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  Also has unusual sensations/spasms-. Here for f/u on SCI. With newly dx'd Anterocollis greater than torticollis-  L 4th digit DIP tendon rupture B/L carpal tunnel syndrome L ulnar compressive neuropathy. 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 But pride hurt more than anything- was fighting with [name] But getting along really well.  Jaylen doing well in [place], [place].   Lats hurting like crazy.  [facility] didn't want to do imaging of Lumbar spine.   Pops in neck and down into arms and down to low back as well - and into pecs When breaths, can feel muscles pushing out- like not enough room in body. Like compressing on lungs.   Keeps going, and won't stay in bed  Now blister on L foot- and dried up Healing up-  from that But burned on arms/wrists due to cooking on grill!   Exam: Skin peeling on L lateral foot- but new skin underneath it from blister that popped.     Plan: Put bandaid  on L lateral foot/ankle.    2.  D/w pt about his dystonia is probably why he's popping so much- and went over that doing Botox  next visit.   3. Patient here for trigger point injections for  Consent done and on chart.  Cleaned areas with alcohol and injected using a 27 gauge 1.5 inch needle  Injected  9cc- none wasted Using 1% Lidocaine  with no EPI  Upper traps B/L x2 Levators- B/L  Posterior scalenes- b/L  Middle scalenes- b/L x2 Splenius Capitus- B/L  Pectoralis Major- b/L  Rhomboids- B/L  x4 Deltoids- B/L  Teres Major/minor- b/L  Thoracic paraspinals- B/L x3 Lumbar paraspinals- B/L  Other injections- B/L Forearms, B/ lhands, B/L piriformis,    Patient's level of pain prior was Current level of pain after injections is  There was no bleeding or complications.  Patient was advised to drink a lot of water on day after injections to flush system Will have increased soreness for 12-48 hours after injections.  Can use Lidocaine  patches the day AFTER injections Can use theracane on day of injections in places didn't inject Can use heating pad 4-6 hours AFTER injections  3.  F/U in 1 months for Botox  AND trp Injections.    I spent a total of 25   minutes on total care today- >50% coordination of care- due to  9 minutes on injections- and rest d/w pt about his pain and looked at skin- went over medical issues he's having outside of SCI

## 2024-07-21 ENCOUNTER — Encounter: Admitting: Physical Medicine and Rehabilitation

## 2024-08-18 ENCOUNTER — Ambulatory Visit: Admitting: Physical Medicine and Rehabilitation

## 2024-08-25 ENCOUNTER — Encounter: Attending: Physical Medicine and Rehabilitation | Admitting: Physical Medicine and Rehabilitation

## 2024-08-25 ENCOUNTER — Encounter: Payer: Self-pay | Admitting: Physical Medicine and Rehabilitation

## 2024-08-25 VITALS — BP 113/68 | HR 92 | Wt 178.0 lb

## 2024-08-25 DIAGNOSIS — M7918 Myalgia, other site: Secondary | ICD-10-CM | POA: Diagnosis present

## 2024-08-25 DIAGNOSIS — Z993 Dependence on wheelchair: Secondary | ICD-10-CM | POA: Diagnosis not present

## 2024-08-25 DIAGNOSIS — G8221 Paraplegia, complete: Secondary | ICD-10-CM | POA: Diagnosis not present

## 2024-08-25 DIAGNOSIS — G249 Dystonia, unspecified: Secondary | ICD-10-CM | POA: Diagnosis present

## 2024-08-25 DIAGNOSIS — G8220 Paraplegia, unspecified: Secondary | ICD-10-CM | POA: Diagnosis present

## 2024-08-25 MED ORDER — ONABOTULINUMTOXINA 100 UNITS IJ SOLR
100.0000 [IU] | Freq: Once | INTRAMUSCULAR | Status: AC
Start: 2024-08-25 — End: 2024-08-25
  Administered 2024-08-25: 100 [IU] via INTRAMUSCULAR

## 2024-08-25 MED ORDER — LIDOCAINE HCL 1 % IJ SOLN
6.0000 mL | Freq: Once | INTRAMUSCULAR | Status: AC
Start: 2024-08-25 — End: 2024-08-25
  Administered 2024-08-25: 6 mL

## 2024-08-25 NOTE — Patient Instructions (Signed)
  Plan: Botox  for spasticity/dystonia- went over risk and benefits- consent signed- for dystonia of back muscles.   B/L upper traps- 20 units each  Rhomboids- 20 units each- 2 spots each B/L teres - 20 units each B/L - latissmus dorsi 20 units- 3 spots B/L Cervical paraspinals- 20 units each  2 spots each Thoracic paraspinals  20units each side 2 spots each Scalenes 10 units each side Lumbar paraspinals 10 units each  300 units total   Patient here for trigger point injections for  Consent done and on chart.  Cleaned areas with alcohol and injected using a 27 gauge 1.5 inch needle  Injected  6 cc- none wasted Using 1% Lidocaine  with no EPI  Upper traps B/L  Levators B/L  Posterior scalenes Middle scalenes Splenius Capitus B/L  Pectoralis Major B/L  Rhomboids Infraspinatus Teres Major/minor Thoracic paraspinals Lumbar paraspinals Other injections-  B/L hands and L thenar eminence   Pain level when came in was 9/10 Now 5-6/10   There was no bleeding or complications.  Patient was advised to drink a lot of water on day after injections to flush system Will have increased soreness for 12-48 hours after injections.  Can use Lidocaine  patches the day AFTER injections Can use theracane on day of injections in places didn't inject Can use heating pad 4-6 hours AFTER injections  2. Still tight in diaphragm- but don't feel comfortable injecting in any way shape or form!.    3 .  Has 3 refills on valium   left- wait on refills til next month.   4.  F/U q 1 month for trp Injections and Botox  in 3 months  5. Wait on referral for Ileopsoas- HF's- for botox -
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
## 2024-09-15 ENCOUNTER — Encounter: Attending: Physical Medicine and Rehabilitation | Admitting: Physical Medicine and Rehabilitation

## 2024-09-15 ENCOUNTER — Encounter: Payer: Self-pay | Admitting: Physical Medicine and Rehabilitation

## 2024-09-15 VITALS — BP 142/78 | HR 98 | Ht 72.0 in | Wt 178.0 lb

## 2024-09-15 DIAGNOSIS — R252 Cramp and spasm: Secondary | ICD-10-CM | POA: Diagnosis not present

## 2024-09-15 DIAGNOSIS — G8220 Paraplegia, unspecified: Secondary | ICD-10-CM | POA: Diagnosis not present

## 2024-09-15 DIAGNOSIS — Z993 Dependence on wheelchair: Secondary | ICD-10-CM | POA: Diagnosis not present

## 2024-09-15 DIAGNOSIS — M7918 Myalgia, other site: Secondary | ICD-10-CM | POA: Insufficient documentation

## 2024-09-15 MED ORDER — LIDOCAINE HCL 1 % IJ SOLN
9.0000 mL | Freq: Once | INTRAMUSCULAR | Status: AC
  Administered 2024-09-15: 9 mL

## 2024-09-15 NOTE — Patient Instructions (Signed)
 Plan: We discussed his shoulder issues and increased pain- I think it's due to Botox -which is making him weaker- so will NOT do Botox  again- should wear off in ~ 2 months.   2. Patient here for trigger point injections for  Consent done and on chart.  Cleaned areas with alcohol and injected using a 27 gauge 1.5 inch needle  Injected 9cc-  Using 1% Lidocaine  with no EPI  Upper traps B/L x2 Levators B/L  Posterior scalenes Middle scalenes B/L  Splenius Capitus B/L x2 Pectoralis Major B/L  Rhomboids B/L x3 Infraspinatus B/L  Teres Major/minor B/L x2 Thoracic paraspinals B/L x2 Lumbar paraspinals B/L x2 Other injections- B/L hands x2  Was 8/10 and down to 6/10 . Only lasts 1-2 weeks per pt There was no bleeding or complications.  Patient was advised to drink a lot of water on day after injections to flush system Will have increased soreness for 12-48 hours after injections.  Can use Lidocaine  patches the day AFTER injections Can use theracane on day of injections in places didn't inject Can use heating pad 4-6 hours AFTER injections  3.  We discussed his BP 142/78- a little elevated- he thinks due to parking spot location and his pain levels.   4.  Discussing pain mgmt and where he's going to get it.   5. F/U in 1 months-

## 2024-09-15 NOTE — Progress Notes (Signed)
 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  Also has unusual sensations/spasms-. Here for f/u on SCI. With newly dx'd Anterocollis greater than torticollis-  L 4th digit DIP tendon rupture B/L carpal tunnel syndrome L ulnar compressive neuropathy. Got approved for power assist w/c- will get in next month Here for fu on paraplegia and associated pain issues and trigger point injections and Botox  for dystonia         Hips are locking and hurting real bad on lateral hips-  And up on sides to chest- Rotates and pops out around chest  Doesn't want to do anything, but lay down- slower and weaker.   Is getting weaker - can transfers, but cannot lift weights Even when does dig stim, shoulder hurts so bad.    Botox   Cannot reach to go forward to do bowel program And shoulder into neck and pecs really hurting One big muscle  Agitated this week-    [facility]- saw NP because Dr Cornelius- took medical leave.  Wants to follow him.  And [facility] won't see anymore- referred to [facility]- trying to get someone to see him more regularly.  He's concerned about pain meds.   Sounds like getting weaker due to Botox .  Never had shoulder surgery- but has bad shoulders- B/L torn rotator cuffs.  Dr Gaynell?  Does cortisone shots in shoulders, but not since 8 months ago.  And having shoulder ball popping out of shoulder.    Has fallen x2  Plan: We discussed his shoulder issues and increased pain- I think it's due to Botox -which is making him weaker- so will NOT do Botox  again- should wear off in ~ 2 months.   2. Patient here for trigger point injections for  Consent done and on chart.  Cleaned areas with alcohol and injected using a 27 gauge 1.5 inch needle  Injected 9cc-  Using 1%  Lidocaine  with no EPI  Upper traps B/L x2 Levators B/L  Posterior scalenes Middle scalenes B/L  Splenius Capitus B/L x2 Pectoralis Major B/L  Rhomboids B/L x3 Infraspinatus B/L  Teres Major/minor B/L x2 Thoracic paraspinals B/L x2 Lumbar paraspinals B/L x2 Other injections- B/L hands x2  Was 8/10 and down to 6/10 . Only lasts 1-2 weeks per pt There was no bleeding or complications.  Patient was advised to drink a lot of water on day after injections to flush system Will have increased soreness for 12-48 hours after injections.  Can use Lidocaine  patches the day AFTER injections Can use theracane on day of injections in places didn't inject Can use heating pad 4-6 hours AFTER injections  3.  We discussed his BP 142/78- a little elevated- he thinks due to parking spot location and his pain levels.   4.  Discussing pain mgmt and where he's going to get it.   5. F/U in 1 months-   6. Make sure you call [facility] doctor to get injections.    I spent a total of 26   minutes on total care today- >50% coordination of care- due to d/w pt about pain mgmt- and BP- not concerned about AD right now- is better- also needs B/L shoulder injections  Also trp Injections- 6 minutes

## 2024-10-05 ENCOUNTER — Other Ambulatory Visit: Payer: Self-pay | Admitting: Physical Medicine and Rehabilitation

## 2024-10-13 ENCOUNTER — Encounter: Attending: Physical Medicine and Rehabilitation | Admitting: Physical Medicine and Rehabilitation

## 2024-10-13 ENCOUNTER — Encounter: Payer: Self-pay | Admitting: Physical Medicine and Rehabilitation

## 2024-10-13 VITALS — BP 121/68 | HR 85 | Ht 72.0 in | Wt 175.8 lb

## 2024-10-13 DIAGNOSIS — G950 Syringomyelia and syringobulbia: Secondary | ICD-10-CM | POA: Diagnosis not present

## 2024-10-13 DIAGNOSIS — G8220 Paraplegia, unspecified: Secondary | ICD-10-CM | POA: Diagnosis not present

## 2024-10-13 DIAGNOSIS — Z993 Dependence on wheelchair: Secondary | ICD-10-CM | POA: Insufficient documentation

## 2024-10-13 DIAGNOSIS — R252 Cramp and spasm: Secondary | ICD-10-CM | POA: Insufficient documentation

## 2024-10-13 DIAGNOSIS — M7918 Myalgia, other site: Secondary | ICD-10-CM | POA: Diagnosis not present

## 2024-10-13 MED ORDER — LIDOCAINE HCL 1 % IJ SOLN
9.0000 mL | Freq: Once | INTRAMUSCULAR | Status: AC
  Administered 2024-10-13: 9 mL

## 2024-10-13 MED ORDER — BACLOFEN 20 MG PO TABS: 20.0000 mg | ORAL_TABLET | Freq: Four times a day (QID) | ORAL | 1 refills | Status: AC

## 2024-10-13 NOTE — Patient Instructions (Signed)
" °  Stopped gabapentin 1 week ago- 600 mg TID- weaned down last 3 months- so this can be complicating the Sx's- because coming off could have made your Sx's worse?   - let's try to figure this out-  Gabapentin 600 mg at bedtime  just for now- and see if Symptoms are better than they have been the past few weeks - I think we will find it's not the cause, but do think it's contributing. Let me know if need a new Rx.   2.  2nd week, let's increase Baclofen  a little- to 20 in Am and 40 in afternoon and 20 mg at night. Sent in new rx.   3. Needs new cover for cushion- wrote Rx- and gave to pt for 18x18 [facility] Quadtro cushion cover.   4. Patient here for trigger point injections for  Consent done and on chart.  Cleaned areas with alcohol and injected using a 27 gauge 1.5 inch needle  Injected 9cc none wasted Using 1% Lidocaine  with no EPI  Upper traps B/L x2 and B/L supraspinatus Levators B/L  Posterior scalenes Middle scalenes- b/L  Splenius Capitus B/L  Pectoralis Major B/L  Rhomboids B/L x3 Infraspinatus Teres Major/minor b/L  Thoracic paraspinals B/L x3 Lumbar paraspinals BL x2 Other injections- B/L hands and biceps    There was no bleeding or complications.  Patient was advised to drink a lot of water on day after injections to flush system Will have increased soreness for 12-48 hours after injections.  Can use Lidocaine  patches the day AFTER injections Can use theracane on day of injections in places didn't inject Can use heating pad 4-6 hours AFTER injections  5. F/U in 1 month- trp Injections and f/u on parpalegia/spasticity  "
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
## 2024-11-10 ENCOUNTER — Ambulatory Visit: Admitting: Physical Medicine and Rehabilitation

## 2024-11-17 ENCOUNTER — Ambulatory Visit: Attending: Physical Medicine and Rehabilitation | Admitting: Physical Medicine and Rehabilitation

## 2024-12-15 ENCOUNTER — Encounter: Attending: Physical Medicine and Rehabilitation | Admitting: Physical Medicine and Rehabilitation

## 2024-12-29 ENCOUNTER — Encounter: Admitting: Physical Medicine and Rehabilitation

## 2025-01-12 ENCOUNTER — Encounter: Admitting: Physical Medicine and Rehabilitation

## 2025-01-31 ENCOUNTER — Encounter: Admitting: Physical Medicine and Rehabilitation

## 2025-02-16 ENCOUNTER — Encounter: Admitting: Physical Medicine and Rehabilitation

## 2025-03-16 ENCOUNTER — Encounter: Admitting: Physical Medicine and Rehabilitation
